# Patient Record
Sex: Male | Born: 1970 | Race: White | Hispanic: No | Marital: Married | Smoking: Never smoker
Health system: Southern US, Community
[De-identification: ages and names within clinical notes are randomized; demographics above are authoritative.]

## PROBLEM LIST (undated history)

## (undated) DIAGNOSIS — R7303 Prediabetes: Secondary | ICD-10-CM

## (undated) DIAGNOSIS — E785 Hyperlipidemia, unspecified: Secondary | ICD-10-CM

## (undated) DIAGNOSIS — T7840XA Allergy, unspecified, initial encounter: Secondary | ICD-10-CM

## (undated) DIAGNOSIS — F419 Anxiety disorder, unspecified: Secondary | ICD-10-CM

## (undated) DIAGNOSIS — F909 Attention-deficit hyperactivity disorder, unspecified type: Secondary | ICD-10-CM

## (undated) DIAGNOSIS — Z973 Presence of spectacles and contact lenses: Secondary | ICD-10-CM

## (undated) DIAGNOSIS — I10 Essential (primary) hypertension: Secondary | ICD-10-CM

## (undated) DIAGNOSIS — K5792 Diverticulitis of intestine, part unspecified, without perforation or abscess without bleeding: Secondary | ICD-10-CM

## (undated) HISTORY — PX: COLON SURGERY: SHX602

## (undated) HISTORY — DX: Attention-deficit hyperactivity disorder, unspecified type: F90.9

## (undated) HISTORY — DX: Hyperlipidemia, unspecified: E78.5

## (undated) HISTORY — DX: Diverticulitis of intestine, part unspecified, without perforation or abscess without bleeding: K57.92

## (undated) HISTORY — DX: Prediabetes: R73.03

## (undated) HISTORY — DX: Presence of spectacles and contact lenses: Z97.3

## (undated) HISTORY — DX: Allergy, unspecified, initial encounter: T78.40XA

## (undated) HISTORY — PX: VASECTOMY: SHX75

## (undated) HISTORY — DX: Essential (primary) hypertension: I10

## (undated) HISTORY — PX: KNEE SURGERY: SHX244

## (undated) HISTORY — DX: Anxiety disorder, unspecified: F41.9

---

## 1990-12-16 HISTORY — PX: KNEE SURGERY: SHX244

## 1991-12-17 HISTORY — PX: ANKLE SURGERY: SHX546

## 2005-09-25 ENCOUNTER — Encounter: Payer: Self-pay | Admitting: Internal Medicine

## 2005-09-25 ENCOUNTER — Ambulatory Visit: Payer: Self-pay | Admitting: Family Medicine

## 2005-09-25 ENCOUNTER — Encounter: Admission: RE | Admit: 2005-09-25 | Discharge: 2005-09-25 | Payer: Self-pay | Admitting: Family Medicine

## 2005-09-25 LAB — CONVERTED CEMR LAB: PSA: 0.37 ng/mL

## 2005-12-29 ENCOUNTER — Inpatient Hospital Stay (HOSPITAL_COMMUNITY): Admission: EM | Admit: 2005-12-29 | Discharge: 2005-12-31 | Payer: Self-pay | Admitting: Emergency Medicine

## 2005-12-29 ENCOUNTER — Encounter: Payer: Self-pay | Admitting: Internal Medicine

## 2005-12-29 ENCOUNTER — Ambulatory Visit: Payer: Self-pay | Admitting: Internal Medicine

## 2006-01-14 ENCOUNTER — Ambulatory Visit: Payer: Self-pay | Admitting: Internal Medicine

## 2007-03-03 ENCOUNTER — Ambulatory Visit: Payer: Self-pay | Admitting: Internal Medicine

## 2007-03-03 LAB — CONVERTED CEMR LAB
ALT: 45 units/L — ABNORMAL HIGH (ref 0–40)
AST: 31 units/L (ref 0–37)
Albumin: 4.3 g/dL (ref 3.5–5.2)
Alkaline Phosphatase: 49 units/L (ref 39–117)
BUN: 14 mg/dL (ref 6–23)
Basophils Absolute: 0 10*3/uL (ref 0.0–0.1)
Basophils Relative: 0.2 % (ref 0.0–1.0)
Bilirubin, Direct: 0.2 mg/dL (ref 0.0–0.3)
CO2: 30 meq/L (ref 19–32)
Calcium: 9.4 mg/dL (ref 8.4–10.5)
Chloride: 101 meq/L (ref 96–112)
Creatinine, Ser: 1.1 mg/dL (ref 0.4–1.5)
Eosinophils Absolute: 0.2 10*3/uL (ref 0.0–0.6)
Eosinophils Relative: 4 % (ref 0.0–5.0)
GFR calc Af Amer: 98 mL/min
GFR calc non Af Amer: 81 mL/min
Glucose, Bld: 86 mg/dL (ref 70–99)
HCT: 45.6 % (ref 39.0–52.0)
Hemoglobin: 16 g/dL (ref 13.0–17.0)
Lymphocytes Relative: 21 % (ref 12.0–46.0)
MCHC: 35.2 g/dL (ref 30.0–36.0)
MCV: 85.9 fL (ref 78.0–100.0)
Monocytes Absolute: 0.5 10*3/uL (ref 0.2–0.7)
Monocytes Relative: 8.9 % (ref 3.0–11.0)
Neutro Abs: 3.7 10*3/uL (ref 1.4–7.7)
Neutrophils Relative %: 65.9 % (ref 43.0–77.0)
Platelets: 184 10*3/uL (ref 150–400)
Potassium: 4.2 meq/L (ref 3.5–5.1)
RBC: 5.31 M/uL (ref 4.22–5.81)
RDW: 12 % (ref 11.5–14.6)
Sodium: 135 meq/L (ref 135–145)
TSH: 1.64 microintl units/mL (ref 0.35–5.50)
Total Bilirubin: 1.1 mg/dL (ref 0.3–1.2)
Total Protein: 6.8 g/dL (ref 6.0–8.3)
WBC: 5.6 10*3/uL (ref 4.5–10.5)

## 2007-04-27 ENCOUNTER — Encounter: Payer: Self-pay | Admitting: Internal Medicine

## 2007-04-27 DIAGNOSIS — J301 Allergic rhinitis due to pollen: Secondary | ICD-10-CM | POA: Insufficient documentation

## 2007-04-27 DIAGNOSIS — E785 Hyperlipidemia, unspecified: Secondary | ICD-10-CM | POA: Insufficient documentation

## 2007-04-27 DIAGNOSIS — J309 Allergic rhinitis, unspecified: Secondary | ICD-10-CM

## 2007-04-27 DIAGNOSIS — F411 Generalized anxiety disorder: Secondary | ICD-10-CM | POA: Insufficient documentation

## 2007-05-04 ENCOUNTER — Ambulatory Visit: Payer: Self-pay | Admitting: Internal Medicine

## 2007-11-05 ENCOUNTER — Ambulatory Visit: Payer: Self-pay | Admitting: Internal Medicine

## 2007-11-05 DIAGNOSIS — F900 Attention-deficit hyperactivity disorder, predominantly inattentive type: Secondary | ICD-10-CM | POA: Insufficient documentation

## 2007-12-03 ENCOUNTER — Ambulatory Visit: Payer: Self-pay | Admitting: Internal Medicine

## 2008-01-01 ENCOUNTER — Telehealth (INDEPENDENT_AMBULATORY_CARE_PROVIDER_SITE_OTHER): Payer: Self-pay | Admitting: *Deleted

## 2008-01-17 ENCOUNTER — Emergency Department (HOSPITAL_COMMUNITY): Admission: EM | Admit: 2008-01-17 | Discharge: 2008-01-18 | Payer: Self-pay | Admitting: Emergency Medicine

## 2008-01-28 ENCOUNTER — Ambulatory Visit: Payer: Self-pay | Admitting: Internal Medicine

## 2008-02-01 ENCOUNTER — Telehealth (INDEPENDENT_AMBULATORY_CARE_PROVIDER_SITE_OTHER): Payer: Self-pay | Admitting: *Deleted

## 2008-03-02 ENCOUNTER — Telehealth (INDEPENDENT_AMBULATORY_CARE_PROVIDER_SITE_OTHER): Payer: Self-pay | Admitting: *Deleted

## 2008-04-05 ENCOUNTER — Telehealth (INDEPENDENT_AMBULATORY_CARE_PROVIDER_SITE_OTHER): Payer: Self-pay | Admitting: *Deleted

## 2008-05-03 ENCOUNTER — Telehealth (INDEPENDENT_AMBULATORY_CARE_PROVIDER_SITE_OTHER): Payer: Self-pay | Admitting: *Deleted

## 2008-05-31 ENCOUNTER — Ambulatory Visit: Payer: Self-pay | Admitting: Internal Medicine

## 2008-07-05 ENCOUNTER — Telehealth (INDEPENDENT_AMBULATORY_CARE_PROVIDER_SITE_OTHER): Payer: Self-pay | Admitting: *Deleted

## 2008-08-04 ENCOUNTER — Telehealth (INDEPENDENT_AMBULATORY_CARE_PROVIDER_SITE_OTHER): Payer: Self-pay | Admitting: *Deleted

## 2008-09-02 ENCOUNTER — Telehealth: Payer: Self-pay | Admitting: Internal Medicine

## 2008-10-03 ENCOUNTER — Telehealth: Payer: Self-pay | Admitting: Internal Medicine

## 2008-10-04 ENCOUNTER — Ambulatory Visit: Payer: Self-pay | Admitting: Family Medicine

## 2008-11-08 ENCOUNTER — Telehealth (INDEPENDENT_AMBULATORY_CARE_PROVIDER_SITE_OTHER): Payer: Self-pay | Admitting: *Deleted

## 2008-12-07 ENCOUNTER — Telehealth: Payer: Self-pay | Admitting: Internal Medicine

## 2009-01-05 ENCOUNTER — Telehealth: Payer: Self-pay | Admitting: Internal Medicine

## 2009-02-06 ENCOUNTER — Telehealth: Payer: Self-pay | Admitting: Internal Medicine

## 2009-02-09 ENCOUNTER — Ambulatory Visit: Payer: Self-pay | Admitting: Internal Medicine

## 2009-03-08 ENCOUNTER — Telehealth: Payer: Self-pay | Admitting: Internal Medicine

## 2009-03-30 ENCOUNTER — Ambulatory Visit: Payer: Self-pay | Admitting: Family Medicine

## 2009-04-10 ENCOUNTER — Telehealth: Payer: Self-pay | Admitting: Internal Medicine

## 2009-05-02 ENCOUNTER — Ambulatory Visit: Payer: Self-pay | Admitting: Family Medicine

## 2009-05-11 ENCOUNTER — Telehealth: Payer: Self-pay | Admitting: Internal Medicine

## 2009-06-09 ENCOUNTER — Telehealth (INDEPENDENT_AMBULATORY_CARE_PROVIDER_SITE_OTHER): Payer: Self-pay | Admitting: *Deleted

## 2009-06-13 ENCOUNTER — Ambulatory Visit: Payer: Self-pay | Admitting: Family Medicine

## 2009-07-11 ENCOUNTER — Telehealth: Payer: Self-pay | Admitting: Internal Medicine

## 2009-07-13 ENCOUNTER — Ambulatory Visit: Payer: Self-pay | Admitting: Family Medicine

## 2009-08-08 ENCOUNTER — Ambulatory Visit: Payer: Self-pay | Admitting: Internal Medicine

## 2009-09-13 ENCOUNTER — Telehealth: Payer: Self-pay | Admitting: Internal Medicine

## 2009-09-15 ENCOUNTER — Ambulatory Visit: Payer: Self-pay | Admitting: Internal Medicine

## 2009-10-19 ENCOUNTER — Telehealth: Payer: Self-pay | Admitting: Internal Medicine

## 2009-11-23 ENCOUNTER — Telehealth: Payer: Self-pay | Admitting: Internal Medicine

## 2009-12-07 ENCOUNTER — Ambulatory Visit: Payer: Self-pay | Admitting: Family Medicine

## 2009-12-13 ENCOUNTER — Ambulatory Visit: Payer: Self-pay | Admitting: Family Medicine

## 2010-01-10 ENCOUNTER — Telehealth: Payer: Self-pay | Admitting: Internal Medicine

## 2010-02-12 ENCOUNTER — Ambulatory Visit: Payer: Self-pay | Admitting: Internal Medicine

## 2010-03-22 ENCOUNTER — Telehealth: Payer: Self-pay | Admitting: Internal Medicine

## 2010-05-01 ENCOUNTER — Telehealth: Payer: Self-pay | Admitting: Internal Medicine

## 2010-05-04 ENCOUNTER — Telehealth: Payer: Self-pay | Admitting: Internal Medicine

## 2010-06-08 ENCOUNTER — Telehealth: Payer: Self-pay | Admitting: Internal Medicine

## 2010-07-11 ENCOUNTER — Telehealth: Payer: Self-pay | Admitting: Internal Medicine

## 2010-08-08 ENCOUNTER — Ambulatory Visit: Payer: Self-pay | Admitting: Internal Medicine

## 2010-08-09 ENCOUNTER — Ambulatory Visit: Payer: Self-pay | Admitting: Family Medicine

## 2010-08-09 DIAGNOSIS — M719 Bursopathy, unspecified: Secondary | ICD-10-CM

## 2010-08-09 DIAGNOSIS — M67919 Unspecified disorder of synovium and tendon, unspecified shoulder: Secondary | ICD-10-CM | POA: Insufficient documentation

## 2010-08-15 ENCOUNTER — Encounter: Admission: RE | Admit: 2010-08-15 | Discharge: 2010-08-15 | Payer: Self-pay | Admitting: Family Medicine

## 2010-09-18 ENCOUNTER — Telehealth: Payer: Self-pay | Admitting: Internal Medicine

## 2010-09-28 ENCOUNTER — Encounter: Payer: Self-pay | Admitting: Internal Medicine

## 2010-10-10 ENCOUNTER — Ambulatory Visit: Payer: Self-pay | Admitting: Internal Medicine

## 2010-10-10 DIAGNOSIS — S335XXA Sprain of ligaments of lumbar spine, initial encounter: Secondary | ICD-10-CM | POA: Insufficient documentation

## 2010-10-25 ENCOUNTER — Telehealth: Payer: Self-pay | Admitting: Internal Medicine

## 2010-10-30 ENCOUNTER — Telehealth: Payer: Self-pay | Admitting: Internal Medicine

## 2010-10-30 ENCOUNTER — Encounter: Payer: Self-pay | Admitting: Internal Medicine

## 2010-11-22 ENCOUNTER — Emergency Department (HOSPITAL_COMMUNITY): Admission: EM | Admit: 2010-11-22 | Discharge: 2010-05-04 | Payer: Self-pay | Admitting: Emergency Medicine

## 2010-11-30 ENCOUNTER — Telehealth: Payer: Self-pay | Admitting: Family Medicine

## 2010-12-05 ENCOUNTER — Ambulatory Visit: Payer: Self-pay | Admitting: Internal Medicine

## 2010-12-05 DIAGNOSIS — J019 Acute sinusitis, unspecified: Secondary | ICD-10-CM

## 2010-12-16 DIAGNOSIS — K5792 Diverticulitis of intestine, part unspecified, without perforation or abscess without bleeding: Secondary | ICD-10-CM

## 2010-12-16 HISTORY — DX: Diverticulitis of intestine, part unspecified, without perforation or abscess without bleeding: K57.92

## 2010-12-19 ENCOUNTER — Encounter: Payer: Self-pay | Admitting: Internal Medicine

## 2011-01-07 ENCOUNTER — Telehealth: Payer: Self-pay | Admitting: Internal Medicine

## 2011-01-15 NOTE — Assessment & Plan Note (Signed)
Summary: back pain/alc   Vital Signs:  Patient profile:   40 year old male Weight:      242.25 pounds Temp:     98.3 degrees F oral Pulse rate:   64 / minute Pulse rhythm:   regular BP sitting:   138 / 90  (left arm) Cuff size:   large  Vitals Entered By: Selena Batten Dance CMA Duncan Dull) (October 10, 2010 8:27 AM) CC: Back pain   History of Present Illness: CC: back pain  3d h/o lower back pain.  Severe sharp pain when occurs.  No radiation, no radiculopathy.  No paresthesias, numbness.  No fevers/chills, bowel/bladder accidents.  once every 3 years bothers him.  No inciting trauma/injury.  taking 2 alleve twice a day and advil here and there as well as some left over robaxin.  Did have awkward land while playing ball in college (this started pain).  No back surgeries.  no smokers at home.  shoulder better after anterior injection.  To start ice skating, notes never has back pain when ice skating 2/2 core muscle strengthening.  Current Medications (verified): 1)  Vyvanse 70 Mg  Caps (Lisdexamfetamine Dimesylate) .Marland Kitchen.. 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  mild L paraspinous mm spasm/tightness.  limited flexion/extension at spine 2/2 pain (does not try).  full ROM in lateral rotation and bending  negative SLR test, no pain with int/ext rotation at hips bilaterally Pulses:  2+ in feet Extremities:  no edema Neurologic:  alert & oriented X3, strength normal in all extremities, and gait normal.     Impression &  Recommendations:  Problem # 1:  LUMBAR STRAIN (ICD-847.2) likely this.  treat conservatively with nsaids, muscle relaxant.  red flags to return discussed, ad vised to return if not improving as expected.  Complete Medication List: 1)  Vyvanse 70 Mg Caps (Lisdexamfetamine dimesylate) .Marland Kitchen.. 1 daily as directed 2)  Aleve 220 Mg Tabs (Naproxen sodium) .... 2 pills twice daily 3)  Flexeril 10 Mg Tabs (Cyclobenzaprine hcl) .... Take one three times a day as needed muscle spasm  Patient Instructions: 1)  I think you have lumbar strain. 2)  Continue alleve and start flexeril for next several days. 3)  Ice/heat to back. 4)  If not getting better you will need to return to be seen. 5)  Good to meet you today, call clinic with quesitons. Prescriptions: FLEXERIL 10 MG TABS (CYCLOBENZAPRINE HCL) take one three times a day as needed muscle spasm  #40 x 0   Entered and Authorized by:   Eustaquio Boyden  MD   Signed by:   Eustaquio Boyden  MD on 10/10/2010   Method used:   Electronically to        [facility]  [place] (retail)       [address]       [place], [place]  [address]       Ph: [phone] or [phone]       Fax: [phone]   RxID:   [id number]    Orders Added: 1)  Est. Patient Level  III [id number]    Current Allergies (reviewed today): ADDERALL (AMPHETAMINE-DEXTROAMPHETAMINE)

## 2011-01-15 NOTE — Progress Notes (Signed)
Summary: VYVANSE  Phone Note Refill Request Call back at Work Phone [phone] Message from:  Patient on October 25, 2010 10:00 AM  Refills Requested: Medication #1:  VYVANSE 70 MG  CAPS 1 daily as directed  Method Requested: Pick up at Office Initial call taken by: Mervin Hack CMA Duncan Dull),  October 25, 2010 10:00 AM  Follow-up for Phone Call        Rx written Follow-up by: Cindee Salt MD,  October 25, 2010 12:42 PM  Additional Follow-up for Phone Call Additional follow up Details #1::        Spoke with patient and advised rx ready for pick-up  Additional Follow-up by: Mervin Hack CMA Duncan Dull),  October 25, 2010 2:45 PM    Prescriptions: VYVANSE 70 MG  CAPS (LISDEXAMFETAMINE DIMESYLATE) 1 daily as directed  #30 x 0   Entered and Authorized by:   Cindee Salt MD   Signed by:   Cindee Salt MD on 10/25/2010   Method used:   Print then Give to Patient   RxID:   [id number]

## 2011-01-15 NOTE — Progress Notes (Signed)
Summary: vyvance  Phone Note Refill Request Call back at Work Phone [phone] Message from:  Patient on July 11, 2010 2:46 PM  Refills Requested: Medication #1:  VYVANSE 70 MG  CAPS 1 daily as directed.  Method Requested: Pick up at Office Initial call taken by: Melody Comas,  July 11, 2010 2:46 PM  Follow-up for Phone Call        Rx written Follow-up by: Cindee Salt MD,  July 11, 2010 2:47 PM  Additional Follow-up for Phone Call Additional follow up Details #1::        Patient advised. Rx left at front for pick up.  Additional Follow-up by: Melody Comas,  July 11, 2010 3:16 PM    Prescriptions: VYVANSE 70 MG  CAPS (LISDEXAMFETAMINE DIMESYLATE) 1 daily as directed  #30 x 0   Entered and Authorized by:   Cindee Salt MD   Signed by:   Cindee Salt MD on 07/11/2010   Method used:   Print then Give to Patient   RxID:   [id number]

## 2011-01-15 NOTE — Miscellaneous (Signed)
Summary: Consent to Special Procedure/[facility]  Consent to Special Procedure/ [facility]   Imported By: Lanelle Bal 12/19/2009 11:31:44  _____________________________________________________________________  External Attachment:    Type:   Image     Comment:   External Document

## 2011-01-15 NOTE — Progress Notes (Signed)
Summary: refill request for vyvanse  Phone Note Refill Request Call back at Home Phone [phone] Message from:  Patient  Refills Requested: Medication #1:  VYVANSE 70 MG  CAPS 1 daily as directed. Please call pt when ready.  Initial call taken by: Lowella Petties CMA,  May 01, 2010 4:18 PM  Follow-up for Phone Call        Rx written Follow-up by: Cindee Salt MD,  May 02, 2010 9:14 AM  Additional Follow-up for Phone Call Additional follow up Details #1::        left message on machine that rx ready for pick-up  Additional Follow-up by: DeShannon Smith CMA Duncan Dull),  May 02, 2010 9:29 AM    Prescriptions: VYVANSE 70 MG  CAPS (LISDEXAMFETAMINE DIMESYLATE) 1 daily as directed  #30 x 0   Entered and Authorized by:   Cindee Salt MD   Signed by:   Cindee Salt MD on 05/02/2010   Method used:   Print then Give to Patient   RxID:   [id number]

## 2011-01-15 NOTE — Progress Notes (Signed)
Summary: Rx Vyvanse  Phone Note Refill Request Call back at Work Phone [phone] Message from:  Patient on January 10, 2010 1:09 PM  Refills Requested: Medication #1:  VYVANSE 70 MG  CAPS 1 daily as directed Patient called to request a refill.  Please advise   Method Requested: Pick up at Office Initial call taken by: Linde Gillis CMA Duncan Dull),  January 10, 2010 1:11 PM  Follow-up for Phone Call        Rx written Follow-up by: Cindee Salt MD,  January 10, 2010 1:57 PM  Additional Follow-up for Phone Call Additional follow up Details #1::        Spoke with patient and advised rx ready for pick-up  Additional Follow-up by: Mervin Hack CMA Duncan Dull),  January 10, 2010 2:48 PM    Prescriptions: VYVANSE 70 MG  CAPS (LISDEXAMFETAMINE DIMESYLATE) 1 daily as directed  #30 x 0   Entered and Authorized by:   Cindee Salt MD   Signed by:   Cindee Salt MD on 01/10/2010   Method used:   Print then Give to Patient   RxID:   [id number]

## 2011-01-15 NOTE — Letter (Signed)
Summary: [facility]  [facility]   Imported By: Maryln Gottron 10/17/2010 12:36:48  _____________________________________________________________________  External Attachment:    Type:   Image     Comment:   External Document  Appended Document: [facility] continued left shoulder problems tried glenohumeral injection

## 2011-01-15 NOTE — Assessment & Plan Note (Signed)
Summary: 6 MONTH F/UP, L SHOULDER PAIN/RBH   Vital Signs:  Patient profile:   40 year old male Weight:      236 pounds Temp:     98.4 degrees F oral Pulse rate:   76 / minute Pulse rhythm:   regular BP sitting:   120 / 80  (left arm) Cuff size:   large  Vitals Entered By: Mervin Hack CMA Duncan Dull) (August 08, 2010 8:38 AM) CC: 6 month follow-up   History of Present Illness: Doing okay  Had another flare of diverticulitis in may did improve with the antibiotics  Still having trouble with left shoulder Marked reduced ROM---can't even putt Has given up lifting weight Limited abduction Gives him trouble at work  uses the vyvanse for work Still helps his concentration  Allergies: 1)  Adderall (Amphetamine-Dextroamphetamine)  Past History:  Past medical, surgical, family and social histories (including risk factors) reviewed for relevance to current acute and chronic problems.  Past Medical History: Reviewed history from 11/05/2007 and no changes required. Allergic rhinitis Anxiety Hyperlipidemia ADHD  Past Surgical History: Diverticulitis  1/07, 5/11  Family History: Reviewed history from 02/09/2009 and no changes required. Father: Alive Mother: Died at age 69, complications diabetes,?MI Siblings: 2 brothers, 1 sister, good health Mat GM died of Altzheimers Strong FH of prostate cancer Mat GF, uncle also died of MIs  Social History: Reviewed history from 02/12/2010 and no changes required. 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         chronic sleep problems---relates to flexible shifts (switches from 1st to 3rd) appetite is fine  Physical Exam  General:  alert and normal appearance.   Neck:  supple, no masses, and no thyromegaly.   Lungs:  normal respiratory effort, no intercostal retractions, no  accessory muscle use, and normal breath sounds.   Heart:  normal rate, regular rhythm, no murmur, and no gallop.   Msk:  mild decreased abduction of left shoulder but fair passive internal and external rotation Popping and crackling with ROM  no other active synovitis Extremities:  no edema Psych:  normally interactive, good eye contact, not anxious appearing, and not depressed appearing.     Impression & Recommendations:  Problem # 1:  SHOULDER PAIN, LEFT (ICD-719.41) Assessment New seems more arthritic now very limiting little help from aleve will set up with Dr Patsy Lager  Problem # 2:  ATTENTION DEFICIT DISORDER (ICD-314.00) Assessment: Unchanged doing fine with the med will continue  Complete Medication List: 1)  Vyvanse 70 Mg Caps (Lisdexamfetamine dimesylate) .Marland Kitchen.. 1 daily as directed  Patient Instructions: 1)  Please set up appt with Dr Patsy Lager to evaluate left shoulder pain 2)  Please schedule a follow-up appointment in 6 months for physical Prescriptions: VYVANSE 70 MG  CAPS (LISDEXAMFETAMINE DIMESYLATE) 1 daily as directed  #30 x 0   Entered and Authorized by:   Cindee Salt MD   Signed by:   Cindee Salt MD on 08/08/2010   Method used:   Print then Give to Patient   RxID:   [id number]   Current Allergies (reviewed today): ADDERALL (AMPHETAMINE-DEXTROAMPHETAMINE)  Appended Document: Orders Update    Clinical Lists Changes  Orders: Added new Service order of Est. Patient Level IV (56213) - Signed

## 2011-01-15 NOTE — Progress Notes (Signed)
Summary: call a nurse   Phone Note Call from Patient   Summary of Call: Triage Record Num: [id number] Operator: Peri Jefferson Patient Name: John Adkins Call Date & Time: 05/03/2010 11:39:51PM Patient Phone: [phone] PCP: [name] Patient Gender: Male PCP Fax : Patient DOB: 1971/04/24 Practice Name: [facility] Reason for Call: [name] calling because he developed left sided abdominal pain on 05/02/10. Has hx of diverticulitis. Pain is unbearable now. Temp 100.3 O. No diarrhea. [facility]. Protocol(s) Used: Abdominal Pain / Discomfort Recommended Outcome per Protocol: See ED Immediately Reason for Outcome: Unbearable abdominal/pelvic pain Care Advice:  ~ Another adult should drive.  ~ Pain medication or laxatives should not be taken until symptoms are evaluated.  ~ Do not eat or drink anything until evaluated by provider. Call EMS 911 if signs and symptoms of shock develop (such as unable to stand due to faintness, dizziness, or lightheadedness; new onset of confusion; slow to respond or difficult to awaken; skin is pale, gray, cool, or moist to touch; severe weakness; loss of consciousness).  ~  ~ IMMEDIATE ACTION 05/03/2010 11:47:11PM Page 1 of 1 [email or web address] Initial call taken by: Melody Comas,  May 04, 2010 10:39 AM  Follow-up for Phone Call        Please check on him Cindee Salt MD  May 04, 2010 11:00 AM   pt states he feels better, still in some pain but not as bad, he went to [facility], he will call for appt if needed. DeShannon Smith CMA Duncan Dull)  May 04, 2010 2:22 PM    noted Follow-up by: Cindee Salt MD,  May 04, 2010 2:24 PM

## 2011-01-15 NOTE — Assessment & Plan Note (Signed)
Summary: CPX / LFW   Vital Signs:  Patient profile:   40 year old male Weight:      234 pounds Temp:     98.4 degrees F oral Pulse rate:   80 / minute Pulse rhythm:   regular BP sitting:   118 / 78  (left arm) Cuff size:   large  Vitals Entered By: Mervin Hack CMA Duncan Dull) (February 12, 2010 2:59 PM) CC: adult physical   History of Present Illness:  Working too many hours not sleeping enough  ongoing trouble with left shoulder seemed to worsen when he lifted for something Uses power flex machine at [employer] to work on it  Still uses the vyvanse regularly only for work in general does forget the dose sometimes He does note a difference with poor concentration at work  still chewing tobacco has tried patch in past---used 2 patches and gum when he quit  Discussed trying this again  Allergies: 1)  Adderall (Amphetamine-Dextroamphetamine)  Past History:  Past medical, surgical, family and social histories (including risk factors) reviewed for relevance to current acute and chronic problems.  Past Medical History: Reviewed history from 11/05/2007 and no changes required. Allergic rhinitis Anxiety Hyperlipidemia ADHD  Past Surgical History: Reviewed history from 04/27/2007 and no changes required. Diverticulitis 01/07  Family History: Reviewed history from 02/09/2009 and no changes required. Father: Alive Mother: Died at age 54, complications diabetes,?MI Siblings: 2 brothers, 1 sister, good health Mat GM died of Altzheimers Strong FH of prostate cancer Mat GF, uncle also died of MIs  Social History: Reviewed history from 02/09/2009 and no changes required. 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 Hard time when works first shift. May go back to  weekends eventually Wears seat belt. Eyes:  Denies double vision and vision loss-1 eye. ENT:  Denies decreased hearing and ringing in ears; teeth okay--overdue for dentist. CV:  Complains of lightheadness; denies chest pain or discomfort, difficulty breathing at night, difficulty breathing while lying down, palpitations, and shortness of breath with exertion; occ dizziness if he stands up too quick. Resp:  Denies cough and shortness of breath. GI:  Denies abdominal pain, bloody stools, change in bowel habits, dark tarry stools, and indigestion; recent stomach but in house . GU:  Denies erectile dysfunction, urinary frequency, and urinary hesitancy. MS:  Complains of joint pain; denies joint swelling; just shoulders. Derm:  Complains of rash; denies lesion(s); occ brief rash that comes and goes--wonders about some allergic exposure in house. Neuro:  Denies headaches, numbness, tingling, and weakness. Psych:  Denies anxiety and depression. Heme:  Denies abnormal bruising and enlarge lymph nodes. Allergy:  Denies seasonal allergies and sneezing.  Physical Exam  General:  alert and normal appearance.   Eyes:  pupils equal, pupils round, pupils reactive to light, and no optic disk abnormalities.   Ears:  R ear normal and L ear normal.   Mouth:  no erythema and no lesions.   Neck:  supple, no masses, no thyromegaly, no carotid bruits, and no cervical lymphadenopathy.   Lungs:  normal respiratory effort and normal breath sounds.   Heart:  normal rate, regular rhythm, no murmur, and no gallop.   Abdomen:  soft and non-tender.   Msk:  no joint tenderness and no joint swelling.   Left shoulder has moderate decrease  in external rotation  Pulses:  2+ in feet Extremities:  no edema Neurologic:  alert & oriented X3, strength normal in all extremities, and gait normal.   Skin:  no rashes and no suspicious lesions.   Axillary Nodes:  No palpable lymphadenopathy Psych:  normally interactive, good eye  contact, not anxious appearing, and not depressed appearing.     Impression & Recommendations:  Problem # 1:  LOW BACK PAIN, ACUTE (ICD-724.2) Assessment Comment Only counselled on healthy behaviors discussed stopping tobacco  Problem # 2:  ATTENTION DEFICIT DISORDER (ICD-314.00) Assessment: Unchanged does okay with the medicine  Complete Medication List: 1)  Vyvanse 70 Mg Caps (Lisdexamfetamine dimesylate) .Marland Kitchen.. 1 daily as directed  Patient Instructions: 1)  Please schedule a follow-up appointment in 6 months .  Prescriptions: VYVANSE 70 MG  CAPS (LISDEXAMFETAMINE DIMESYLATE) 1 daily as directed  #30 x 0   Entered and Authorized by:   Cindee Salt MD   Signed by:   Cindee Salt MD on 02/12/2010   Method used:   Print then Give to Patient   RxID:   [id number]   Current Allergies (reviewed today): ADDERALL (AMPHETAMINE-DEXTROAMPHETAMINE)

## 2011-01-15 NOTE — Assessment & Plan Note (Signed)
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 I saw him last year and he was having some significant rotator cuff tendinitis, and that, the subacromial injection, he went to physical therapy for formal scapular stabilization and rotator cuff strengthening. He got quite a bit better at that point. He never became fully asymptomatic, however he did get better, but now he has had some drastic return of symptoms, and significant pain.  Severe pain with reaching across the body, he does have a dull ache and pain at nighttime, and has limited his physical activity.  He used to be a Engineering geologist. Now he cannot lift weights, he cannot even play golf.  REVIEW OF SYSTEMS  GEN: No systemic complaints, no fevers, chills, sweats, or other acute illnesses MSK: Detailed in the HPI GI: tolerating PO intake without difficulty Neuro: No numbness, parasthesias, or tingling associated. Otherwise the pertinent positives of the ROS are noted above.    Allergies: 1)  Adderall (Amphetamine-Dextroamphetamine)  Physical Exam  General:  GEN: Well-developed,well-nourished,in no acute distress; alert,appropriate and cooperative throughout examination HEENT: Normocephalic and atraumatic without obvious abnormalities. No apparent alopecia or balding. Ears, externally no deformities PULM: Breathing comfortably in no respiratory distress EXT: No clubbing, cyanosis, or edema PSYCH: Normally interactive. Cooperative  during the interview. Pleasant. Friendly and conversant. Not anxious or depressed appearing. Normal, full affect.  Msk:  Shoulder: L Inspection: No muscle wasting or winging Ecchymosis/edema: neg  AC joint, scapula, clavicle: AC TTP Cervical spine: NT, full ROM Spurling's: neg Abduction: full, 5/5 Flexion: full, 5/5 IR, full, lift-off: 5/5 ER at neutral: full, 5/5 AC crossover: MARKEDLY POS Neer: pos Hawkins: pos Drop Test: neg Empty Can: pos Supraspinatus insertion: mild-mod T Bicipital groove: TTP Speed's: POS Yergason's: POS Sulcus sign: neg Scapular dyskinesis: none C5-T1 intact  Neuro: Sensation intact Grip 5/5    Impression & Recommendations:  Problem # 1:  ROTATOR CUFF SYNDROME, LEFT (ICD-726.10) left-sided, severe rotator cuff tendinopathy, with probable subacromial bursitis. Severely symptomatic a.c. joint as well. Very knowledgeable collegiate baseball player, who has been doing rotator cuff strengthening and scapular stabilization, status post one subacromial injection.  Given his clinical presentation, think it is most prudent to obtain formal films, plain x-rays, an MRI of his left shoulder to evaluate his rotator cuff and acromioclavicular joint, as well as glenohumeral joint.  Symptomatic > 1 year, failure conservative management  I'm going to consult Dr. Ave Filter about potential definitive management.  Orders: T-Shoulder Left Min 2 Views [phone]) Radiology Referral (Radiology) Orthopedic Surgeon Referral (Ortho Surgeon)  Problem # 2:  SHOULDER PAIN, LEFT (ICD-719.41)  Orders: T-Shoulder Left Min 2 Views [phone]) Radiology Referral (Radiology) Orthopedic Surgeon Referral (Ortho Surgeon)  Complete Medication List: 1)  Vyvanse 70 Mg Caps (Lisdexamfetamine dimesylate) .Marland Kitchen.. 1 daily as directed  Patient Instructions: 1)  Referral Appointment Information 2)  Day/Date: 3)  Time: 4)  Place/MD: 5)  Address: 6)  Phone/Fax: 7)  Patient given  appointment information. Information/Orders faxed/mailed.   Current Allergies (reviewed today): ADDERALL (AMPHETAMINE-DEXTROAMPHETAMINE)

## 2011-01-15 NOTE — Progress Notes (Signed)
Summary: REQ MEDS REFILL VYVANSE  Phone Note Refill Request   Refills Requested: Medication #1:  VYVANSE 70MG  NEED RX REFILL FOR VYVANSE 70MG - PT TO PICK UP CALL BACK # [phone]   Method Requested: Pick up at Office Initial call taken by: Daine Gip,  June 08, 2010 1:54 PM  Follow-up for Phone Call        Rx written Follow-up by: Cindee Salt MD,  June 08, 2010 1:58 PM  Additional Follow-up for Phone Call Additional follow up Details #1::        Spoke with patient and advised rx ready for pick-up  Additional Follow-up by: Mervin Hack CMA Duncan Dull),  June 08, 2010 3:19 PM    Prescriptions: VYVANSE 70 MG  CAPS (LISDEXAMFETAMINE DIMESYLATE) 1 daily as directed  #30 x 0   Entered and Authorized by:   Cindee Salt MD   Signed by:   Cindee Salt MD on 06/08/2010   Method used:   Print then Give to Patient   RxID:   [id number]

## 2011-01-15 NOTE — Letter (Signed)
Summary: Proof of Physical Form/[employer]  Proof of Physical Form/[employer]   Imported By: Lanelle Bal 11/07/2010 15:26:54  _____________________________________________________________________  External Attachment:    Type:   Image     Comment:   External Document

## 2011-01-15 NOTE — Progress Notes (Signed)
Summary: refill request for vyvanse  Phone Note Refill Request Call back at Work Phone [phone] Message from:  Patient  Refills Requested: Medication #1:  VYVANSE 70 MG  CAPS 1 daily as directed. Please call when ready.  Initial call taken by: Lowella Petties CMA,  September 18, 2010 3:40 PM Caller: Patient  Follow-up for Phone Call        Rx written Follow-up by: Cindee Salt MD,  September 19, 2010 7:50 AM  Additional Follow-up for Phone Call Additional follow up Details #1::        left message on machine at work that rx ready for pick-up  Additional Follow-up by: Mervin Hack CMA Duncan Dull),  September 19, 2010 8:41 AM    Prescriptions: VYVANSE 70 MG  CAPS (LISDEXAMFETAMINE DIMESYLATE) 1 daily as directed  #30 x 0   Entered and Authorized by:   Cindee Salt MD   Signed by:   Cindee Salt MD on 09/19/2010   Method used:   Print then Give to Patient   RxID:   [id number]

## 2011-01-15 NOTE — Progress Notes (Signed)
Summary: needs refill on vyvanse  Phone Note Refill Request Call back at Work Phone [phone] Message from:  Patient  Refills Requested: Medication #1:  VYVANSE 70 MG  CAPS 1 daily as directed. Initial call taken by: Lowella Petties CMA,  March 22, 2010 3:27 PM  Follow-up for Phone Call        Rx written Follow-up by: Cindee Salt MD,  March 23, 2010 8:12 AM  Additional Follow-up for Phone Call Additional follow up Details #1::        Spoke with patient and advised rx ready for pick-up  Additional Follow-up by: Mervin Hack CMA Duncan Dull),  March 23, 2010 8:38 AM    New/Updated Medications: VYVANSE 70 MG  CAPS (LISDEXAMFETAMINE DIMESYLATE) 1 daily as directed Prescriptions: VYVANSE 70 MG  CAPS (LISDEXAMFETAMINE DIMESYLATE) 1 daily as directed  #30 x 0   Entered and Authorized by:   Cindee Salt MD   Signed by:   Cindee Salt MD on 03/23/2010   Method used:   Print then Give to Patient   RxID:   [id number]

## 2011-01-15 NOTE — Progress Notes (Signed)
Summary: Need form filled out  Phone Note Call from Patient Call back at Home Phone [phone] Call back at Work Phone [phone]   Caller: Patient Call For: Cindee Salt MD Summary of Call: Form in your desk to be signed for patient asking for proof of physical, pt last physical was 02/12/2010. Initial call taken by: Mervin Hack CMA Duncan Dull),  October 30, 2010 3:24 PM  Follow-up for Phone Call        form signed Follow-up by: Cindee Salt MD,  October 31, 2010 8:11 AM  Additional Follow-up for Phone Call Additional follow up Details #1::        Spoke with patient and advised form ready for pick-up, form also scanned.  Additional Follow-up by: Mervin Hack CMA (AAMA),  October 31, 2010 8:21 AM

## 2011-01-17 NOTE — Progress Notes (Signed)
Summary: refill request for vyvanse  Phone Note Refill Request Call back at [phone] Message from:  Patient  Refills Requested: Medication #1:  VYVANSE 70 MG  CAPS 1 daily as directed Please call pt when ready.  Initial call taken by: Lowella Petties CMA, AAMA,  January 07, 2011 2:29 PM  Follow-up for Phone Call        Rx written Follow-up by: Cindee Salt MD,  January 07, 2011 2:44 PM  Additional Follow-up for Phone Call Additional follow up Details #1::        Spoke with patient and advised rx ready for pick-up  Additional Follow-up by: Mervin Hack CMA Duncan Dull),  January 07, 2011 3:21 PM    Prescriptions: VYVANSE 70 MG  CAPS (LISDEXAMFETAMINE DIMESYLATE) 1 daily as directed  #30 x 0   Entered and Authorized by:   Cindee Salt MD   Signed by:   Cindee Salt MD on 01/07/2011   Method used:   Print then Give to Patient   RxID:   [id number]

## 2011-01-17 NOTE — Progress Notes (Signed)
Summary: refill request for vyvanse  Phone Note Refill Request Call back at Work Phone [phone] Message from:  Patient  Refills Requested: Medication #1:  VYVANSE 70 MG  CAPS 1 daily as directed Please call when ready.  Initial call taken by: Lowella Petties CMA, AAMA,  November 30, 2010 8:13 AM    Prescriptions: VYVANSE 70 MG  CAPS (LISDEXAMFETAMINE DIMESYLATE) 1 daily as directed  #30 x 0   Entered and Authorized by:   Ruthe Mannan MD   Signed by:   Ruthe Mannan MD on 11/30/2010   Method used:   Print then Give to Patient   RxID:   [id number]   Appended Document: refill request for vyvanse Left message on cell phone voicemail, Rx ready for pick up will be left at front desk.

## 2011-01-17 NOTE — Assessment & Plan Note (Signed)
Summary: CONGESTION,COUGH/CLE   Vital Signs:  Patient profile:   40 year old male Weight:      240 pounds O2 Sat:      98 % on Room air Temp:     98.8 degrees F oral Pulse rate:   103 / minute Pulse rhythm:   regular Resp:     14 per minute BP sitting:   150 / 100  (left arm) Cuff size:   large  Vitals Entered By: Mervin Hack CMA Duncan Dull) (December 05, 2010 12:42 PM)  O2 Flow:  Room air CC: congestion    History of Present Illness: Has been sick for 3-4 weeks intermittent over this time seems to be worsening now terrible night last night---- work was tough (fumes, etc)  May have had some low grade fever some sweats in bed Mild DOE at work Lots of cough---intermittent Gianino and yellow sputum  some head congestion some nasal drainage and PND--also purulent uses the zyrtec all the time no other meds for this  No sore throat some ear pain last night--brief  Allergies: 1)  Adderall (Amphetamine-Dextroamphetamine)  Past History:  Past medical, surgical, family and social histories (including risk factors) reviewed for relevance to current acute and chronic problems.  Past Medical History: Reviewed history from 11/05/2007 and no changes required. Allergic rhinitis Anxiety Hyperlipidemia ADHD  Past Surgical History: Reviewed history from 08/08/2010 and no changes required. Diverticulitis  1/07, 5/11  Family History: Reviewed history from 02/09/2009 and no changes required. Father: Alive Mother: Died at age 12, complications diabetes,?MI Siblings: 2 brothers, 1 sister, good health Mat GM died of Altzheimers Strong FH of prostate cancer Mat GF, uncle also died of MIs  Social History: Reviewed history from 02/12/2010 and no changes required. 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  NAD Head:  no sinus tenderness Ears:  R ear normal and L ear normal.   Nose:  moderate inflammation and swelling Mouth:  no erythema and no exudates.   Neck:  supple, no masses, and no cervical lymphadenopathy.   Lungs:  normal respiratory effort, no intercostal retractions, no accessory muscle use, normal breath sounds, no crackles, and no wheezes.     Impression & Recommendations:  Problem # 1:  SINUSITIS - ACUTE-NOS (ICD-461.9) Assessment New  clearly seems to have secondary bacterial infeciton will treat with augmentin analgesics  His updated medication list for this problem includes:    Amoxicillin-pot Clavulanate 875-125 Mg Tabs (Amoxicillin-pot clavulanate) .Marland Kitchen... 1 tab by mouth two times a day with food for sinus infection  Complete Medication List: 1)  Vyvanse 70 Mg Caps (Lisdexamfetamine dimesylate) .Marland Kitchen.. 1 daily as directed 2)  Aleve 220 Mg Tabs (Naproxen sodium) .... 2 pills twice daily 3)  Flexeril 10 Mg Tabs (Cyclobenzaprine hcl) .... Take one three times a day as needed muscle spasm 4)  Amoxicillin-pot Clavulanate 875-125 Mg Tabs (Amoxicillin-pot clavulanate) .Marland Kitchen.. 1 tab by mouth two times a day with food for sinus infection 5)  Tramadol Hcl 50 Mg Tabs (Tramadol hcl) .Marland Kitchen.. 1-2 tabs at night to suppress cough  Patient Instructions: 1)  Please call next week if no better 2)  Please keep March appt Prescriptions: TRAMADOL HCL 50 MG TABS (TRAMADOL HCL) 1-2 tabs at night to suppress cough  #  30 x 0   Entered and Authorized by:   Cindee Salt MD   Signed by:   Cindee Salt MD on 12/05/2010   Method used:   Elect[facility] to        [facility]  [address] [id number] (retail[facility]       [address]       [place], [place]  [address]       Ph: [phone] or [phone]       Fax: [phone]   RxID:   [id number] AMOXICILLIN-POT CLAVULANATE 875-125 MG TABS (AMOXICILLIN-POT CLAVULANATE) 1 tab by mouth two  times a day with food for sinus infection  #20 x 0   Entered and Authorized by:   Cindee Salt MD   Signed by:   Cindee Salt MD on 12/05/2010   Method used:   [facility]        [facility]  [address] [id number]       [address]       [place], [place]  [address]       Ph: [phone] or [phone]       Fax: [phone]   RxID:   [id number]    Orders Added: 1)  Est. Patient Level III [76160]    Current Allergies (reviewed today): ADDERALL (AMPHETAMINE-DEXTROAMPHETAMINE)

## 2011-01-17 NOTE — Letter (Signed)
Summary: [facility]  [facility]   Imported By: Lanelle Bal 01/03/2011 09:09:58  _____________________________________________________________________  External Attachment:    Type:   Image     Comment:   External Document  Appended Document: [facility] planning left shoulder surgery

## 2011-01-19 ENCOUNTER — Encounter: Payer: Self-pay | Admitting: Internal Medicine

## 2011-02-14 ENCOUNTER — Telehealth: Payer: Self-pay | Admitting: Internal Medicine

## 2011-02-14 ENCOUNTER — Encounter: Payer: Self-pay | Admitting: Family Medicine

## 2011-02-14 ENCOUNTER — Ambulatory Visit (INDEPENDENT_AMBULATORY_CARE_PROVIDER_SITE_OTHER): Payer: Commercial Managed Care - PPO | Admitting: Family Medicine

## 2011-02-14 DIAGNOSIS — K5732 Diverticulitis of large intestine without perforation or abscess without bleeding: Secondary | ICD-10-CM

## 2011-02-15 ENCOUNTER — Telehealth: Payer: Self-pay | Admitting: Family Medicine

## 2011-02-21 NOTE — Progress Notes (Signed)
Summary: refill request for vyvanse  Phone Note Refill Request Call back at [phone] Message from:  Patient  Refills Requested: Medication #1:  VYVANSE 70 MG  CAPS 1 daily as directed Please call when ready.    Initial call taken by: Lowella Petties CMA, AAMA,  February 14, 2011 12:42 PM  Follow-up for Phone Call        Rx written Follow-up by: Cindee Salt MD,  February 14, 2011 12:46 PM  Additional Follow-up for Phone Call Additional follow up Details #1::        left message on machine that rx ready for pick-up  Additional Follow-up by: DeShannon Smith CMA Duncan Dull),  February 14, 2011 1:51 PM    Prescriptions: VYVANSE 70 MG  CAPS (LISDEXAMFETAMINE DIMESYLATE) 1 daily as directed  #30 x 0   Entered and Authorized by:   Cindee Salt MD   Signed by:   Cindee Salt MD on 02/14/2011   Method used:   Print then Give to Patient   RxID:   [id number]

## 2011-02-21 NOTE — Progress Notes (Signed)
Summary: cipro called to pharmacy  Phone Note Call from Patient   Caller: Patient Summary of Call: Pt was seen yesterday, states cipro was to have been called to pharmacy, it was not.  I called it in as 500 mg's twice a day times one week, as per chart note.  Called to [facility]. Initial call taken by: Lowella Petties CMA, AAMA,  February 15, 2011 11:59 AM  Follow-up for Phone Call        Thank you. Follow-up by: Shaune Leeks MD,  February 15, 2011 4:11 PM    New/Updated Medications: CIPRO 500 MG TABS (CIPROFLOXACIN HCL) take one by mouth twice a day x one week  Prior Medications: VYVANSE 70 MG  CAPS (LISDEXAMFETAMINE DIMESYLATE) 1 daily as directed ALEVE 220 MG TABS (NAPROXEN SODIUM) 2 pills twice daily FLEXERIL 10 MG TABS (CYCLOBENZAPRINE HCL) take one three times a day as needed muscle spasm TRAMADOL HCL 50 MG TABS (TRAMADOL HCL) 1-2 tabs at night to suppress cough Current Allergies: ADDERALL (AMPHETAMINE-DEXTROAMPHETAMINE)

## 2011-02-21 NOTE — Assessment & Plan Note (Signed)
Summary: DIVERTICULITIS/CLE   Vital Signs:  Patient profile:   40 year old male Weight:      242.25 pounds Temp:     99.0 degrees F oral Pulse rate:   84 / minute Pulse rhythm:   regular BP sitting:   120 / 90  (left arm) Cuff size:   regular  Vitals Entered By: Sydell Axon LPN (February 13, 4097 4:02 PM) CC: ? Diverticulitis, abd pain   History of Present Illness: Pt here for abd pain. He has had diverticulitis two, maybe three times in the past. Thre first time was at [facility] ER and had a CT scan done and was admitted for three days and was given IV ABs and more to take when he got home.  He has a CT on the chart 2007 with findings c/w diverticulitis. He has had LLQ pain since yesterday. It is a pain he cannot touch or make better. It is grippy and cycles. It is LLQ. His last BM was this AM and was nml. He has not had N/V except N when the pain cycles worst. He does not think he has had a fever. He does not follow a prudent Divertic diet and has not tried fiber.  Problems Prior to Update: 1)  Sinusitis - Acute-nos  (ICD-461.9) 2)  Lumbar Strain  (ICD-847.2) 3)  Rotator Cuff Syndrome, Left  (ICD-726.10) 4)  Shoulder Pain, Left  (ICD-719.41) 5)  Preventive Health Care  (ICD-V70.0) 6)  Attention Deficit Disorder  (ICD-314.00) 7)  Hyperlipidemia  (ICD-272.4) 8)  Anxiety  (ICD-300.00) 9)  Allergic Rhinitis  (ICD-477.9)  Medications Prior to Update: 1)  Vyvanse 70 Mg  Caps (Lisdexamfetamine Dimesylate) .Marland Kitchen.. 1 Daily As Directed 2)  Aleve 220 Mg Tabs (Naproxen Sodium) .... 2 Pills Twice Daily 3)  Flexeril 10 Mg Tabs (Cyclobenzaprine Hcl) .... Take One Three Times A Day As Needed Muscle Spasm 4)  Tramadol Hcl 50 Mg Tabs (Tramadol Hcl) .Marland Kitchen.. 1-2 Tabs At Night To Suppress Cough  Allergies: 1)  Adderall (Amphetamine-Dextroamphetamine)  Physical Exam  General:  Well-developed,well-nourished,in no acute distress; alert,appropriate and cooperative throughout examination,  nontoxic. Head:  no sinus tenderness Lungs:  normal respiratory effort, no intercostal retractions, no accessory muscle use, normal breath sounds, no crackles, and no wheezes.   Heart:  normal rate, regular rhythm, no murmur, and no gallop.   Abdomen:  Bowel sounds positive,abdomen soft and tender without masses in the LLQ, no organomegaly or hernias noted. No rebound or percussive tenderness. Obturator and heel tap negative.   Impression & Recommendations:  Problem # 1:  DIVERTICULITIS, ACUTE, PRESUMED (ICD-562.11) Assessment Deteriorated  Recurrent, acute beginning yesterday. Start Cipro 500 two times a day for one week.  If sxs don't improve after three days, add Flagyl 250 four times a day and extend Cipro the same amt of time. RTC if sxs worsen or to ER for eval and poss admission.  Complete Medication List: 1)  Vyvanse 70 Mg Caps (Lisdexamfetamine dimesylate) .Marland Kitchen.. 1 daily as directed 2)  Aleve 220 Mg Tabs (Naproxen sodium) .... 2 pills twice daily 3)  Flexeril 10 Mg Tabs (Cyclobenzaprine hcl) .... Take one three times a day as needed muscle spasm 4)  Tramadol Hcl 50 Mg Tabs (Tramadol hcl) .Marland Kitchen.. 1-2 tabs at night to suppress cough 5)  Flagyl 250 Mg Tabs (Metronidazole) .... One tab by mouth four times a day.  Patient Instructions: 1)  RTC or call if sxs don't resolve. Prescriptions: FLAGYL 250 MG TABS (  METRONIDAZOLE) one tab by mouth four times a day.  #28 x 0   Entered and Authorized by:   Shaune Leeks MD   Signed by:   Shaune Leeks MD on 02/14/2011   Method used:   Print then Give to Patient   RxID:   [id number]    Orders Added: 1)  Est. Patient Level III [14782]    Current Allergies (reviewed today): ADDERALL (AMPHETAMINE-DEXTROAMPHETAMINE)

## 2011-03-06 ENCOUNTER — Encounter: Payer: Self-pay | Admitting: Internal Medicine

## 2011-03-20 ENCOUNTER — Other Ambulatory Visit: Payer: Self-pay | Admitting: *Deleted

## 2011-03-20 MED ORDER — LISDEXAMFETAMINE DIMESYLATE 70 MG PO CAPS: 70.0000 mg | ORAL_CAPSULE | ORAL | Status: DC

## 2011-03-20 NOTE — Telephone Encounter (Signed)
Left message on machine that rx is ready for pickup  

## 2011-03-27 ENCOUNTER — Encounter: Payer: Self-pay | Admitting: Internal Medicine

## 2011-03-27 ENCOUNTER — Ambulatory Visit (INDEPENDENT_AMBULATORY_CARE_PROVIDER_SITE_OTHER): Payer: Self-pay | Admitting: Internal Medicine

## 2011-03-27 VITALS — BP 128/80 | HR 74 | Temp 98.4°F | Ht 71.0 in | Wt 239.0 lb

## 2011-03-27 DIAGNOSIS — Z0000 Encounter for general adult medical examination without abnormal findings: Secondary | ICD-10-CM | POA: Insufficient documentation

## 2011-03-27 DIAGNOSIS — M719 Bursopathy, unspecified: Secondary | ICD-10-CM

## 2011-03-27 DIAGNOSIS — F988 Other specified behavioral and emotional disorders with onset usually occurring in childhood and adolescence: Secondary | ICD-10-CM

## 2011-03-27 DIAGNOSIS — M67919 Unspecified disorder of synovium and tendon, unspecified shoulder: Secondary | ICD-10-CM

## 2011-03-27 DIAGNOSIS — S335XXA Sprain of ligaments of lumbar spine, initial encounter: Secondary | ICD-10-CM

## 2011-03-27 MED ORDER — CYCLOBENZAPRINE HCL 10 MG PO TABS: 10.0000 mg | ORAL_TABLET | Freq: Three times a day (TID) | ORAL | Status: DC | PRN

## 2011-03-27 NOTE — Progress Notes (Signed)
Subjective:    Patient ID: John Adkins, male    DOB: 1971/08/29, 40 y.o.   MRN: [id number]  HPI Diverticulitis has settled down 2 spells this year but improved Trying to be careful with diet  Still satisfied with the med Uses mostly for work or other times that may require sustained attention Stress with 40 year old--concerned about Asperger's  Ongoing shoulder issues Delaying surgery for repair Gets along for now  Some hip pain on right May have strained it walking up a hill Plans to retry the flexeril--Rx sent Ongoing back issues--intermittent Better when he skates playing hockey--works his core  Past Medical History  Diagnosis Date  . Allergy   . Anxiety   . Hyperlipidemia   . ADHD (attention deficit hyperactivity disorder)     Past Surgical History  Procedure Date  . Colon surgery     Family History  Problem Relation Age of Onset  . Diabetes Mother   . Alzheimer's disease Maternal Grandmother   . Heart disease Maternal Grandfather     History   Social History  . Marital Status: Married    Spouse Name: N/A    Number of Children: 2  . Years of Education: N/A   Occupational History  . Buyer, retail   Social History Main Topics  . Smoking status: Never Smoker   . Smokeless tobacco: Current User    Types: Chew  . Alcohol Use: Not on file  . Drug Use: Not on file  . Sexually Active: Not on file   Other Topics Concern  . Not on file   Social History Narrative  . No narrative on file   Review of Systems  Constitutional: Negative for fatigue and unexpected weight change.       Some fluctuation of weight--being careful again Wears seat belt  HENT: Positive for rhinorrhea and postnasal drip. Negative for hearing loss and tinnitus.        Uses claritin prn   Eyes: Negative for itching and visual disturbance.       No vision loss or diplopia  Respiratory: Negative for cough, chest tightness and shortness of breath.     Cardiovascular: Negative for chest pain, palpitations and leg swelling.  Gastrointestinal: Negative for nausea, vomiting, abdominal pain and blood in stool.       No heartburn  Genitourinary: Negative for dysuria, urgency, decreased urine volume and difficulty urinating.       No ED  Musculoskeletal: Positive for back pain and arthralgias. Negative for joint swelling and gait problem.  Skin: Negative for rash.       Chronic vitiligo--no changes No suspicious lesions  Neurological: Negative for dizziness, syncope, weakness, numbness and headaches.  Hematological: Negative for adenopathy. Does not bruise/bleed easily.  Psychiatric/Behavioral: Positive for sleep disturbance. Negative for dysphoric mood. The patient is not nervous/anxious.        Readjusting to 1st shift--just changed jobs Hard for him to sleep at night       Objective:   Physical Exam  Constitutional: He is oriented to person, place, and time. He appears well-developed and well-nourished. No distress.  HENT:  Head: Normocephalic and atraumatic.  Right Ear: External ear normal.  Left Ear: External ear normal.  Mouth/Throat: Oropharynx is clear and moist. No oropharyngeal exudate.       TMs normal  Eyes: Conjunctivae and EOM are normal. Pupils are equal, round, and reactive to light.       Fundi benign  Neck: Normal range of motion. Neck supple. No thyromegaly present.  Cardiovascular: Normal rate, regular rhythm, normal heart sounds and intact distal pulses.  Exam reveals no gallop.   No murmur heard. Pulmonary/Chest: Effort normal and breath sounds normal. No respiratory distress. He has no rales.  Abdominal: Soft. He exhibits no mass. There is no tenderness.  Musculoskeletal: He exhibits no edema and no tenderness.  Lymphadenopathy:    He has no cervical adenopathy.    He has no axillary adenopathy.  Neurological: He is alert and oriented to person, place, and time. He exhibits normal muscle tone.       Normal  strength  Skin: Skin is warm. No rash noted.  Psychiatric: He has a normal mood and affect. His behavior is normal. Judgment and thought content normal.          Assessment & Plan:

## 2011-04-08 ENCOUNTER — Telehealth: Payer: Self-pay | Admitting: *Deleted

## 2011-04-08 MED ORDER — CIPROFLOXACIN HCL 500 MG PO TABS: 500.0000 mg | ORAL_TABLET | Freq: Two times a day (BID) | ORAL | Status: AC

## 2011-04-08 NOTE — Telephone Encounter (Signed)
rx sent to pharmacy, spoke with patient and advised results, pt states he will call next week

## 2011-04-08 NOTE — Telephone Encounter (Signed)
I am concerned that he would have another spell so soon. Okay to send Rx for cipro 500mg  bid  #14 x0 Set up appt for next week to review his status and decide if further action is needed (like CT scan)

## 2011-04-08 NOTE — Telephone Encounter (Signed)
Pt states he has had abd pain for a few days, thinks he has diverticulitis flare up. No known fever.  He is asking that antibiotic be called to [facility]- says he wouldn't be able to come to the office until Friday.  Please advise.

## 2011-04-30 ENCOUNTER — Other Ambulatory Visit: Payer: Self-pay | Admitting: *Deleted

## 2011-04-30 NOTE — Telephone Encounter (Signed)
Please call pt when ready.

## 2011-05-01 MED ORDER — LISDEXAMFETAMINE DIMESYLATE 70 MG PO CAPS: 70.0000 mg | ORAL_CAPSULE | ORAL | Status: DC

## 2011-05-01 NOTE — Telephone Encounter (Signed)
Left message on machine that rx ready for pick-up 

## 2011-05-03 NOTE — H&P (Signed)
John Adkins, John Adkins                 ACCOUNT NO.:  [id number]   MEDICAL RECORD NO.:  [id number]          PATIENT TYPE:  EMS   LOCATION:  ED                           FACILITY:  [facility]   PHYSICIAN:  Gordy Savers, M.D. LHCDATE OF BIRTH:  [birth year]   DATE OF ADMISSION:  12/29/2005  DATE OF DISCHARGE:                                HISTORY & PHYSICAL   CHIEF COMPLAINT:  Abdominal pain.   HISTORY OF PRESENT ILLNESS:  The patient is a 40 year old white gentleman  without significant past medical history.  He was well until approximately 4  days ago when he had the onset of some mild nonspecific lower abdominal  pain.  Symptoms waxed and waned.  He felt reasonably well yesterday, in  fact, played a round of golf.  At approximately 8:00 P.M. the day prior to  admission he had the onset of significant left lower quadrant pain.  This  persisted and he was seen in the emergency room for evaluation.  He was  noted to have a temperature of 101 degrees.  A CT scan of the abdomen  revealed findings consistent with acute diverticulitis and also slightly  worrisome for early abscess formation. Laboratory studies included a white  count of 13.7.  He is now admitted for further evaluation and treatment of  acute diverticulitis.   PAST MEDICAL HISTORY:  Fairly unremarkable.   PAST SURGICAL HISTORY:  He has had surgery involving his left ankle and also  right knee arthroscopic surgery.  He has had no overnight hospital  admissions.   MEDICATIONS:  He takes no chronic medications.   ALLERGIES:  He denies any allergies.   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 He has two preschool children.  His wife is a Pharm-D working third shift at [employer].  The patient chews tobacco but does not smoke and is planning to discontinue  this habit.   FAMILY HISTORY:  Father is in his early 47's and in remarkably good health.  Mother died at 72 this past spring from  complications of diabetes.  She also  had a history of diverticulosis.   PHYSICAL EXAMINATION:  VITAL SIGNS:  Temperature 100.8.  GENERAL APPEARANCE:  Examination revealed a well-developed, healthy, fit-  appearing male in no acute distress.  HEENT: Head and neck reveal normal fundi.  Ear, nose and throat clear.  NECK:  Supple.  There is no adenopathy.  CHEST:  Clear.  CARDIOVASCULAR:  Normal S1 and S2. No murmurs.  ABDOMEN:  Revealed active bowel sounds.  There is considerable left greater  than right lower abdominal tenderness.  Mild guarding noted.  External  genitalia normal.  EXTREMITIES: Negative with full peripheral pulses, no edema.   IMPRESSION:  Acute diverticulitis.   DISPOSITION:  The patient will be admitted to the hospital.  He will be  treated with parenteral antibiotic therapy as well as analgesics.  Will  consider a follow up abdominal CT scan in two to three days, depending upon  his clinical status, to exclude abscess formation.  ______________________________  Gordy Savers, M.D. [facility]     PFK/MEDQ  D:  12/29/2005  T:  12/30/2005  Job:  [id number]

## 2011-05-03 NOTE — Discharge Summary (Signed)
Adkins, John                 ACCOUNT NO.:  [id number]   MEDICAL RECORD NO.:  [id number]          PATIENT TYPE:  INP   LOCATION:  1618                         FACILITY:  [facility]   PHYSICIAN:  Rene Paci, M.D. LHCDATE OF BIRTH:  1971-07-18   DATE OF ADMISSION:  12/29/2005  DATE OF DISCHARGE:  12/31/2005                                 DISCHARGE SUMMARY   DISCHARGE DIAGNOSES:  1.  Acute diverticulitis, sigmoid, question contain microperforation.  2.  Tobacco abuse (2/dip at home).   DISCHARGE MEDICATIONS:  1.  Cipro 500 mg p.o. b.i.d. x8 days to complete 10-day course.  2.  Flagyl 500 p.o. t.i.d. x8 to complete 10-day course.  3.  Also a prescription for Vicodin 1-2 q.4 h. p.r.n. pain.  4.  Flexeril 10 mg 1 q.8 h. p.r.n. spasm.  5.  May continue with Wellbutrin XR 1500 mg daily as prior to admission.   POSSIBLE FOLLOWUP:  To be arranged with primary care physician, Dr. Gerlene Burdock  __________ at [facility].  Call for appointment in 1-2 weeks.  Also  instructed to return to the emergency room the following day if unable to  tolerate pills, food, drink, increased pain or increased fever prior to  scheduled appointment.   DISPOSITION:  Patient is discharged home where he lives with his wife and 12  children.   CONDITION ON DISCHARGE:  Medically stable. Afebrile for greater than 24  hours.  Tolerating regular diet as well as oral antibiotics and pain  control.  Condition on discharge is medically improved and stable.   HOSPITAL COURSE BY PROBLEM:  Acute sigmoid diverticulitis.  Patient is a  pleasant 40 year old gentleman who presented to the emergency room with left  lower quadrant pain and fever and was found by CT to have acute  diverticulitis and question of early abscess formation with localized  microperforation.  He was begun on IV Cipro and Flagyl as well as IV  Dilaudid as needed.  His fever and pain symptoms quickly improved.  He  tolerated a regular diet and  was anxious for discharge home after 48 hours  of IV antibiotics.  He was changed to oral equivalent of Cipro and Flagyl  and should he tolerate these as well as oral Vicodin on a p.r.n. basis for  his abdominal pain, he will be allowed discharge home with outpatient  followup to be scheduled with primary MD on conclusion of his antibiotic  therapy.  He has been instructed to return to the emergency room or call MD  if he has increased pain, fever, inability to tolerate his antibiotics.  GI  has not been consulted this admission but further evaluation as an  outpatient regarding episode of diverticulitis at such an early age may be a  consideration in the future.  We will defer this to his primary MD.      Rene Paci, M.D. [facility]  Electronically Signed    VL/MEDQ  D:  12/31/2005  T:  12/31/2005  Job:  [id number]

## 2011-05-17 ENCOUNTER — Ambulatory Visit: Payer: 59 | Admitting: Internal Medicine

## 2011-06-13 ENCOUNTER — Other Ambulatory Visit: Payer: Self-pay | Admitting: *Deleted

## 2011-06-13 MED ORDER — LISDEXAMFETAMINE DIMESYLATE 70 MG PO CAPS: 70.0000 mg | ORAL_CAPSULE | ORAL | Status: DC

## 2011-06-13 NOTE — Telephone Encounter (Signed)
Spoke with patient and advised results   

## 2011-06-13 NOTE — Telephone Encounter (Signed)
Please call pt when ready.

## 2011-06-14 ENCOUNTER — Inpatient Hospital Stay (INDEPENDENT_AMBULATORY_CARE_PROVIDER_SITE_OTHER)
Admission: RE | Admit: 2011-06-14 | Discharge: 2011-06-14 | Disposition: A | Discharge status: Home / Self Care | Payer: 59 | Source: Ambulatory Visit | Attending: Family Medicine | Admitting: Family Medicine

## 2011-06-14 DIAGNOSIS — K5732 Diverticulitis of large intestine without perforation or abscess without bleeding: Secondary | ICD-10-CM

## 2011-06-14 DIAGNOSIS — K5712 Diverticulitis of small intestine without perforation or abscess without bleeding: Secondary | ICD-10-CM

## 2011-06-14 LAB — POCT URINALYSIS DIP (DEVICE)
Bilirubin Urine: NEGATIVE
Glucose, UA: NEGATIVE mg/dL
Hgb urine dipstick: NEGATIVE
Leukocytes, UA: NEGATIVE
Nitrite: NEGATIVE
Protein, ur: NEGATIVE mg/dL
Urobilinogen, UA: 0.2 mg/dL (ref 0.0–1.0)
pH: 5.5 (ref 5.0–8.0)

## 2011-06-20 ENCOUNTER — Ambulatory Visit (INDEPENDENT_AMBULATORY_CARE_PROVIDER_SITE_OTHER): Payer: 59 | Admitting: Internal Medicine

## 2011-06-20 ENCOUNTER — Encounter: Payer: Self-pay | Admitting: Internal Medicine

## 2011-06-20 VITALS — BP 140/90 | HR 84 | Temp 98.5°F | Ht 70.0 in | Wt 234.0 lb

## 2011-06-20 DIAGNOSIS — K5792 Diverticulitis of intestine, part unspecified, without perforation or abscess without bleeding: Secondary | ICD-10-CM | POA: Insufficient documentation

## 2011-06-20 DIAGNOSIS — K5732 Diverticulitis of large intestine without perforation or abscess without bleeding: Secondary | ICD-10-CM

## 2011-06-20 NOTE — Progress Notes (Signed)
  Subjective:    Patient ID: John Adkins, male    DOB: [birth year], 40 y.o.   MRN: [id number]  HPI Seen in ER last week Diagnosed with diverticulitis at least 4-5 times Had 3 fairly significant times---it hurt quite a bit this time (terrible stabbing pain)  No fever No nausea or vomiting Bowels had been fine prior to episode  Feels back to normal now  Current Outpatient Prescriptions on File Prior to Visit  Medication Sig Dispense Refill  . cyclobenzaprine (FLEXERIL) 10 MG tablet Take 1 tablet (10 mg total) by mouth 3 (three) times daily as needed.  60 tablet  0  . lisdexamfetamine (VYVANSE) 70 MG capsule Take 1 capsule (70 mg total) by mouth every morning.  30 capsule  0  . naproxen sodium (ANAPROX) 220 MG tablet Take 220 mg by mouth 2 (two) times daily with meals.          Allergies  Allergen Reactions  . Adderall     REACTION: headache and didn't work well    Past Medical History  Diagnosis Date  . Allergy   . Anxiety   . Hyperlipidemia   . ADHD (attention deficit hyperactivity disorder)   . Diverticulitis     No past surgical history on file.  Family History  Problem Relation Age of Onset  . Diabetes Mother   . Alzheimer's disease Maternal Grandmother   . Heart disease Maternal Grandfather     History   Social History  . Marital Status: Married    Spouse Name: N/A    Number of Children: 2  . Years of Education: N/A   Occupational History  . Buyer, retail   Social History Main Topics  . Smoking status: Never Smoker   . Smokeless tobacco: Current User    Types: Chew  . Alcohol Use: Not on file  . Drug Use: Not on file  . Sexually Active: Not on file   Other Topics Concern  . Not on file   Social History Narrative  . No narrative on file   Review of Systems No cough No SOB Still dragging his feet on shoulder surgery---gets only brief help with cortisone shots     Objective:   Physical Exam  Constitutional: He appears  well-developed and well-nourished. No distress.  Abdominal: Soft. Bowel sounds are normal. He exhibits no mass. There is no tenderness.          Assessment & Plan:

## 2011-06-20 NOTE — Assessment & Plan Note (Signed)
Had fairly severe pain but responded quickly to antibiotics Discussed just waiting and treating prn vs considering surgery He will wait

## 2011-07-03 ENCOUNTER — Inpatient Hospital Stay (INDEPENDENT_AMBULATORY_CARE_PROVIDER_SITE_OTHER)
Admission: RE | Admit: 2011-07-03 | Discharge: 2011-07-03 | Disposition: A | Discharge status: Home / Self Care | Payer: 59 | Source: Ambulatory Visit | Attending: Emergency Medicine | Admitting: Emergency Medicine

## 2011-07-03 ENCOUNTER — Emergency Department (HOSPITAL_COMMUNITY)
Admission: EM | Admit: 2011-07-03 | Discharge: 2011-07-03 | Disposition: A | Discharge status: Home / Self Care | Payer: 59 | Attending: Emergency Medicine | Admitting: Emergency Medicine

## 2011-07-03 ENCOUNTER — Emergency Department (HOSPITAL_COMMUNITY): Payer: 59

## 2011-07-03 ENCOUNTER — Encounter (HOSPITAL_COMMUNITY): Payer: Self-pay | Admitting: Radiology

## 2011-07-03 DIAGNOSIS — R1032 Left lower quadrant pain: Secondary | ICD-10-CM | POA: Insufficient documentation

## 2011-07-03 DIAGNOSIS — R110 Nausea: Secondary | ICD-10-CM | POA: Insufficient documentation

## 2011-07-03 DIAGNOSIS — R509 Fever, unspecified: Secondary | ICD-10-CM

## 2011-07-03 DIAGNOSIS — F988 Other specified behavioral and emotional disorders with onset usually occurring in childhood and adolescence: Secondary | ICD-10-CM | POA: Insufficient documentation

## 2011-07-03 DIAGNOSIS — K5732 Diverticulitis of large intestine without perforation or abscess without bleeding: Secondary | ICD-10-CM | POA: Insufficient documentation

## 2011-07-03 LAB — URINE MICROSCOPIC-ADD ON

## 2011-07-03 LAB — URINALYSIS, ROUTINE W REFLEX MICROSCOPIC
Glucose, UA: NEGATIVE mg/dL
Hgb urine dipstick: NEGATIVE
Ketones, ur: 15 mg/dL — AB
Nitrite: POSITIVE — AB
Protein, ur: 30 mg/dL — AB
Specific Gravity, Urine: 1.028 (ref 1.005–1.030)
pH: 5.5 (ref 5.0–8.0)

## 2011-07-03 LAB — CBC
Hemoglobin: 16.7 g/dL (ref 13.0–17.0)
MCH: 30.8 pg (ref 26.0–34.0)
MCHC: 37.4 g/dL — ABNORMAL HIGH (ref 30.0–36.0)
MCV: 82.3 fL (ref 78.0–100.0)
Platelets: 169 10*3/uL (ref 150–400)
RBC: 5.42 MIL/uL (ref 4.22–5.81)
RDW: 13.2 % (ref 11.5–15.5)

## 2011-07-03 LAB — DIFFERENTIAL
Basophils Absolute: 0 10*3/uL (ref 0.0–0.1)
Basophils Relative: 0 % (ref 0–1)
Eosinophils Absolute: 0 10*3/uL (ref 0.0–0.7)
Eosinophils Relative: 0 % (ref 0–5)
Lymphocytes Relative: 8 % — ABNORMAL LOW (ref 12–46)
Lymphs Abs: 1.2 10*3/uL (ref 0.7–4.0)
Monocytes Absolute: 1.1 10*3/uL — ABNORMAL HIGH (ref 0.1–1.0)
Monocytes Relative: 7 % (ref 3–12)
Neutro Abs: 12.7 10*3/uL — ABNORMAL HIGH (ref 1.7–7.7)
Neutrophils Relative %: 85 % — ABNORMAL HIGH (ref 43–77)

## 2011-07-03 LAB — COMPREHENSIVE METABOLIC PANEL
AST: 13 U/L (ref 0–37)
Albumin: 4.2 g/dL (ref 3.5–5.2)
Alkaline Phosphatase: 64 U/L (ref 39–117)
BUN: 15 mg/dL (ref 6–23)
CO2: 28 mEq/L (ref 19–32)
Calcium: 10.1 mg/dL (ref 8.4–10.5)
Chloride: 100 mEq/L (ref 96–112)
Creatinine, Ser: 1.02 mg/dL (ref 0.50–1.35)
GFR calc Af Amer: >60 mL/min (ref >60)
GFR calc non Af Amer: >60 mL/min (ref >60)
Glucose, Bld: 94 mg/dL (ref 70–99)
Potassium: 4.7 mEq/L (ref 3.5–5.1)
Sodium: 138 mEq/L (ref 135–145)
Total Protein: 7.5 g/dL (ref 6.0–8.3)

## 2011-07-03 MED ORDER — IOHEXOL 300 MG/ML  SOLN
100.0000 mL | Freq: Once | INTRAMUSCULAR | Status: AC | PRN
  Administered 2011-07-03: 100 mL via INTRAVENOUS

## 2011-07-04 LAB — OCCULT BLOOD, POC DEVICE: Fecal Occult Bld: NEGATIVE

## 2011-07-08 ENCOUNTER — Encounter: Payer: Self-pay | Admitting: Internal Medicine

## 2011-07-08 ENCOUNTER — Ambulatory Visit (INDEPENDENT_AMBULATORY_CARE_PROVIDER_SITE_OTHER): Payer: 59 | Admitting: Internal Medicine

## 2011-07-08 VITALS — BP 140/90 | HR 93 | Temp 99.0°F | Ht 70.0 in | Wt 229.0 lb

## 2011-07-08 DIAGNOSIS — K5792 Diverticulitis of intestine, part unspecified, without perforation or abscess without bleeding: Secondary | ICD-10-CM

## 2011-07-08 DIAGNOSIS — K5732 Diverticulitis of large intestine without perforation or abscess without bleeding: Secondary | ICD-10-CM

## 2011-07-08 NOTE — Patient Instructions (Signed)
Please set up the surgery appointment

## 2011-07-08 NOTE — Assessment & Plan Note (Addendum)
Multiple quickly recurrent spells ER records reviewed Severe pain this time He is ready and I highly recommended surgical resection of the abnormal sigmoid Will proceed with consultation

## 2011-07-08 NOTE — Progress Notes (Signed)
  Subjective:    Patient ID: John Adkins, male    DOB: 1971-05-02, 40 y.o.   MRN: [id number]  HPI Had felt fine then suddenly hit again with abdominal pain Had to be seen in ER  CT scan confirmed diverticulitis Got dilaudid for pain in urgent care then sent to ER Has been using 2 percocet at a time wasn't helping Today is the first time he is having some relief  No fever Has been on augmentin for this--has about 5 more days left Appetite is off--just crackers and peanut butter Bowels have started up again---slight loose stool last night and this AM  Current Outpatient Prescriptions on File Prior to Visit  Medication Sig Dispense Refill  . cyclobenzaprine (FLEXERIL) 10 MG tablet Take 1 tablet (10 mg total) by mouth 3 (three) times daily as needed.  60 tablet  0  . HYDROcodone-acetaminophen (NORCO) 5-325 MG per tablet Take 1-2 tablets by mouth every 6 (six) hours as needed.        Marland Kitchen lisdexamfetamine (VYVANSE) 70 MG capsule Take 1 capsule (70 mg total) by mouth every morning.  30 capsule  0  . naproxen sodium (ANAPROX) 220 MG tablet Take 220 mg by mouth 2 (two) times daily with meals.          Allergies  Allergen Reactions  . Adderall     REACTION: headache and didn't work well    Past Medical History  Diagnosis Date  . Allergy   . Anxiety   . Hyperlipidemia   . ADHD (attention deficit hyperactivity disorder)   . Diverticulitis     No past surgical history on file.  Family History  Problem Relation Age of Onset  . Diabetes Mother   . Alzheimer's disease Maternal Grandmother   . Heart disease Maternal Grandfather     History   Social History  . Marital Status: Married    Spouse Name: N/A    Number of Children: 2  . Years of Education: N/A   Occupational History  . Buyer, retail   Social History Main Topics  . Smoking status: Never Smoker   . Smokeless tobacco: Current User    Types: Chew  . Alcohol Use: Not on file  . Drug Use: Not on  file  . Sexually Active: Not on file   Other Topics Concern  . Not on file   Social History Narrative  . No narrative on file   Review of Systems No cough  No SOB     Objective:   Physical Exam  Constitutional: He appears well-developed and well-nourished. No distress.  Pulmonary/Chest: Effort normal and breath sounds normal. No respiratory distress. He has no wheezes. He has no rales.  Abdominal: Soft. Bowel sounds are normal. He exhibits no distension and no mass. There is no rebound and no guarding.       Very minimal tenderness--almost more in RLQ than left          Assessment & Plan:

## 2011-08-05 ENCOUNTER — Other Ambulatory Visit: Payer: Self-pay | Admitting: *Deleted

## 2011-08-05 MED ORDER — LISDEXAMFETAMINE DIMESYLATE 70 MG PO CAPS: 70.0000 mg | ORAL_CAPSULE | ORAL | Status: DC

## 2011-08-05 NOTE — Telephone Encounter (Signed)
vyvanse refill, call when ready

## 2011-08-05 NOTE — Telephone Encounter (Signed)
Patient notified that rx is up front and ready for pickup. 

## 2011-08-07 ENCOUNTER — Other Ambulatory Visit (INDEPENDENT_AMBULATORY_CARE_PROVIDER_SITE_OTHER): Payer: Self-pay | Admitting: General Surgery

## 2011-08-07 ENCOUNTER — Ambulatory Visit (INDEPENDENT_AMBULATORY_CARE_PROVIDER_SITE_OTHER): Payer: Commercial Managed Care - PPO | Admitting: Surgery

## 2011-08-07 ENCOUNTER — Encounter (INDEPENDENT_AMBULATORY_CARE_PROVIDER_SITE_OTHER): Payer: Self-pay | Admitting: Surgery

## 2011-08-07 ENCOUNTER — Telehealth (INDEPENDENT_AMBULATORY_CARE_PROVIDER_SITE_OTHER): Payer: Self-pay | Admitting: General Surgery

## 2011-08-07 VITALS — BP 132/88 | HR 60 | Temp 97.6°F | Ht 71.0 in | Wt 229.5 lb

## 2011-08-07 DIAGNOSIS — K5792 Diverticulitis of intestine, part unspecified, without perforation or abscess without bleeding: Secondary | ICD-10-CM

## 2011-08-07 DIAGNOSIS — K5732 Diverticulitis of large intestine without perforation or abscess without bleeding: Secondary | ICD-10-CM

## 2011-08-07 NOTE — Patient Instructions (Signed)
Avoid straining that increases abdominal pressure to avoid recurrence of diverticulitis Obtain barium enema Avoid peanuts

## 2011-08-07 NOTE — Progress Notes (Signed)
Subjective:     Patient ID: John Adkins, male   DOB: 21-Jan-1971, 40 y.o.   MRN: [id number]  HPI  John Adkins presents today for discussion of surgery for diverticulitis. He is a 40 year old white male who works as an Solicitor at [employer]. He describes multiple attacks of left lower quadrant pain. He has associated him with peanut ingestion. Interestingly, he noted his first attack with pressure when doing leg presses. He is a man who works out and maintains his fitness.  I discussed laparoscopically-assisted sigmoid colectomy in detail indicated the risk and benefits of the procedure. He was not interested in treating this medically since he's been taking a lot of antibiotics and his symptoms tended to wax and Lane. He has never had a barium enema.  We'll proceed with scheduling him for a laparoscopically assisted sigmoid colectomy. In the meantime we'll obtain a barium enema to map his colon. CT scan report:  Sigmoid diverticulitis with diffuse inflammation but  without drainable abscess. No free intraperitoneal air. This  diverticulitis is in a similar location to the 2007 diverticulitis.  No surrounding adenopathy to suggest underlying tumor. This  possibility can be addressed on follow-up after acute episode has  clear.   2 booklets on colon surgery in general and diverticulitis specifically were given to him for his review. Current outpatient prescriptions:cyclobenzaprine (FLEXERIL) 10 MG tablet, Take 1 tablet (10 mg total) by mouth 3 (three) times daily as needed., Disp: 60 tablet, Rfl: 0;  lisdexamfetamine (VYVANSE) 70 MG capsule, Take 1 capsule (70 mg total) by mouth every morning., Disp: 30 capsule, Rfl: 0;  HYDROcodone-acetaminophen (NORCO) 5-325 MG per tablet, Take 1-2 tablets by mouth every 6 (six) hours as needed.  , Disp: , Rfl:  naproxen sodium (ANAPROX) 220 MG tablet, Take 220 mg by mouth 2 (two) times daily with meals.  , Disp: , Rfl:  Past Medical History    Diagnosis Date  . Allergy   . Anxiety   . Hyperlipidemia   . ADHD (attention deficit hyperactivity disorder)   . Diverticulitis   . Abdominal pain   . Wears glasses     wears contacts   Past Surgical History  Procedure Date  . Knee surgery 1992    right   . Ankle surgery 1993    left ankle       Review of Systems  Constitutional: Negative.   HENT: Negative.   Eyes: Negative.   Respiratory: Negative.   Cardiovascular: Negative.   Gastrointestinal: Positive for abdominal pain. Negative for constipation.  Musculoskeletal:       Torn biceps tendon on the left shoulder  Skin: Negative.   Neurological: Negative.   Hematological: Negative.   Psychiatric/Behavioral: Negative.        Objective:   Physical Exam  Constitutional: He is oriented to person, place, and time. He appears well-developed and well-nourished.  HENT:  Head: Normocephalic and atraumatic.  Eyes: Conjunctivae and EOM are normal. Pupils are equal, round, and reactive to light.  Neck: Normal range of motion. Neck supple.  Cardiovascular: Normal rate, regular rhythm and normal heart sounds.   Pulmonary/Chest: Effort normal and breath sounds normal.  Abdominal: Soft. Bowel sounds are normal. There is no tenderness.  Musculoskeletal:       Limited ROM of left shoulder  Neurological: He is alert and oriented to person, place, and time.  Skin: Skin is warm and dry.  Psychiatric: He has a normal mood and affect. His behavior is normal.  Judgment and thought content normal.   Filed Vitals:   08/07/11 1327  BP: 132/88  Pulse: 60  Temp: 97.6 F (36.4 C)       Assessment:     Recurrent diverticulitis.  No history noted of abscess.    Plan:     Laparoscopically assisted sigmoid colectomy

## 2011-08-07 NOTE — Telephone Encounter (Signed)
Contacted [facility] and called out a 1 day colon prep, Nulytley and ErythromycinBase 1gm #3- Neomycin 1gm #3 take 1 each at 3 pm 6pm and 10pm

## 2011-08-13 ENCOUNTER — Ambulatory Visit
Admission: RE | Admit: 2011-08-13 | Discharge: 2011-08-13 | Disposition: A | Discharge status: Home / Self Care | Payer: 59 | Source: Ambulatory Visit | Attending: Surgery | Admitting: Surgery

## 2011-08-13 DIAGNOSIS — K5792 Diverticulitis of intestine, part unspecified, without perforation or abscess without bleeding: Secondary | ICD-10-CM

## 2011-08-17 HISTORY — PX: OTHER SURGICAL HISTORY: SHX169

## 2011-09-02 ENCOUNTER — Other Ambulatory Visit (INDEPENDENT_AMBULATORY_CARE_PROVIDER_SITE_OTHER): Payer: Self-pay | Admitting: Surgery

## 2011-09-02 ENCOUNTER — Encounter (HOSPITAL_COMMUNITY): Payer: 59

## 2011-09-02 LAB — CBC
Hemoglobin: 15.7 g/dL (ref 13.0–17.0)
MCH: 29.9 pg (ref 26.0–34.0)
MCHC: 36.2 g/dL — ABNORMAL HIGH (ref 30.0–36.0)
RDW: 13.2 % (ref 11.5–15.5)

## 2011-09-02 LAB — BASIC METABOLIC PANEL
Calcium: 9.6 mg/dL (ref 8.4–10.5)
GFR calc Af Amer: >60 mL/min (ref >60)
GFR calc non Af Amer: >60 mL/min (ref >60)
Glucose, Bld: 88 mg/dL (ref 70–99)
Potassium: 3.9 mEq/L (ref 3.5–5.1)
Sodium: 140 mEq/L (ref 135–145)

## 2011-09-02 LAB — TYPE AND SCREEN: Antibody Screen: NEGATIVE

## 2011-09-02 LAB — SURGICAL PCR SCREEN: Staphylococcus aureus: NEGATIVE

## 2011-09-04 ENCOUNTER — Other Ambulatory Visit (INDEPENDENT_AMBULATORY_CARE_PROVIDER_SITE_OTHER): Payer: Self-pay | Admitting: Surgery

## 2011-09-04 ENCOUNTER — Inpatient Hospital Stay (HOSPITAL_COMMUNITY)
Admission: RE | Admit: 2011-09-04 | Discharge: 2011-09-09 | DRG: 331 | Disposition: A | Discharge status: Home / Self Care | Payer: 59 | Source: Ambulatory Visit | Attending: Surgery | Admitting: Surgery

## 2011-09-04 DIAGNOSIS — Z01812 Encounter for preprocedural laboratory examination: Secondary | ICD-10-CM

## 2011-09-04 DIAGNOSIS — K5732 Diverticulitis of large intestine without perforation or abscess without bleeding: Principal | ICD-10-CM | POA: Diagnosis present

## 2011-09-04 DIAGNOSIS — K5730 Diverticulosis of large intestine without perforation or abscess without bleeding: Secondary | ICD-10-CM

## 2011-09-04 DIAGNOSIS — F909 Attention-deficit hyperactivity disorder, unspecified type: Secondary | ICD-10-CM | POA: Diagnosis present

## 2011-09-04 DIAGNOSIS — F411 Generalized anxiety disorder: Secondary | ICD-10-CM | POA: Diagnosis present

## 2011-09-04 DIAGNOSIS — E785 Hyperlipidemia, unspecified: Secondary | ICD-10-CM | POA: Diagnosis present

## 2011-09-05 ENCOUNTER — Encounter: Payer: Self-pay | Admitting: Internal Medicine

## 2011-09-05 LAB — DIFFERENTIAL
Basophils Absolute: 0 10*3/uL (ref 0.0–0.1)
Basophils Relative: 0 % (ref 0–1)
Eosinophils Absolute: 0 10*3/uL (ref 0.0–0.7)
Eosinophils Relative: 0 % (ref 0–5)
Lymphocytes Relative: 4 % — ABNORMAL LOW (ref 12–46)
Lymphs Abs: 0.8 10*3/uL (ref 0.7–4.0)
Monocytes Absolute: 1 10*3/uL (ref 0.1–1.0)
Monocytes Relative: 5 % (ref 3–12)
Neutro Abs: 18.2 10*3/uL — ABNORMAL HIGH (ref 1.7–7.7)
Neutrophils Relative %: 91 % — ABNORMAL HIGH (ref 43–77)

## 2011-09-05 LAB — CBC
HCT: 40.5 % (ref 39.0–52.0)
MCH: 30 pg (ref 26.0–34.0)
MCV: 82.7 fL (ref 78.0–100.0)
RBC: 4.9 MIL/uL (ref 4.22–5.81)
WBC: 19.9 10*3/uL — ABNORMAL HIGH (ref 4.0–10.5)

## 2011-09-09 LAB — CBC
HCT: 43.7 % (ref 39.0–52.0)
Hemoglobin: 15.9 g/dL (ref 13.0–17.0)
MCH: 29.6 pg (ref 26.0–34.0)
MCHC: 36.4 g/dL — ABNORMAL HIGH (ref 30.0–36.0)
MCV: 81.2 fL (ref 78.0–100.0)
RDW: 13.3 % (ref 11.5–15.5)

## 2011-09-11 NOTE — Op Note (Signed)
John Adkins, John Adkins                 ACCOUNT NO.:  [id number]  MEDICAL RECORD NO.:  [id number]  LOCATION:  1539                         FACILITY:  [facility]  PHYSICIAN:  Thornton Park. Daphine Deutscher, MD  DATE OF BIRTH:  1971-01-13  DATE OF PROCEDURE:  09/04/2011 DATE OF DISCHARGE:                              OPERATIVE REPORT   PREOPERATIVE DIAGNOSES:  40 year old white male with; 1. Recurrent diverticulitis. 2. History of hyperlipidemia. 3. Attention deficit disorder. 4. Rotator cuff problems on the left side.  PROCEDURE:  Laparoscopically-assisted sigmoid colectomy with mobilization of splenic flexure.  SURGEON:  Thornton Park. Daphine Deutscher, M.D.  ASSISTANT:  Anselm Pancoast. Zachery Dakins, M.D.  ANESTHESIA:  General endotracheal.  FINDINGS:  Focus of acute diverticulitis in the pelvis and left sidewall.  DESCRIPTION OF PROCEDURE:  John Adkins was taken to room 1, on Wednesday, September 04, 2011, and given general anesthesia.  He was placed in the dorsal lithotomy position and was prepped widely with PCMX and draped sterilely.  Access to the abdomen was achieved through the left upper quadrant using 0 degree OptiView without difficulty.  This was done after time-out was performed.  Once in, the abdomen was insufflated and initial port was in the right upper quadrant.  Second port was placed in the midline below the umbilicus, a third placed above the umbilicus in the midline.  With insufflation, I then began mobilization of the sigmoid colon using a harmonic scalpel.  I mobilized the pelvis, put an extra port and over on the left side laterally and used that and then eventually placed a hand port in the lower midline through which I was able to go up and take the splenic flexure down and move it towards the midline and move the left descending colon toward the midline as well.  When completed, I was able to bring the specimen up to the hand assist port.  With that in place, I identified proximal distal  well beyond the area of the really acute inflammation.  I knew the man had some descending tics and he was apprised of that prior to procedure, made aware that there was nowhere he can get rid of all of his tics.  However, we resected this significant disease and did this stapling proximally and distally with a stapling device, dividing the ends and dividing the bowel and then going to the mesentery with Harmonic scalpel oversewing vessels with 2-0 silk suture ligatures.  Dr. Zachery Dakins scoped the patient below and found the remnant was about 20 cm up.  I elected to hand sew this and did so with an end-to-end technique cleaning off the ends and putting together with 3-0 silk pop offs seromuscularly with an inner layer running locking 4-0 PDS converting to canal Mayo.  Second layer of 3-0 silks were used.  When completed, I brought a drain in through the lateral port on the left, put it down on his pelvis.  Dr. Zachery Dakins scoped the patient again, clamped off above pressure, testing the anastomosis under water.  No leak was seen.  Good gas flow above the anastomosis.  Anastomosis appeared to be patent.  Sponge and needle counts were correct.  Lower midline incision  was closed with 2-0 Vicryl on the peritoneum.  The fascia was closed interrupted #1 Novofils.  Wound was irrigated.  __________ was injected in the fascia and the wound was closed with staples.  I reinflated the abdomen, everything looked to be in order.  No bleeding was seen and the drain looked to be in good position.  The patient tolerated the procedure well.  He was taken to recovery room in satisfactory condition.     Thornton Park Daphine Deutscher, MD     MBM/MEDQ  D:  09/04/2011  T:  09/04/2011  Job:  [id number]  cc:   Karie Schwalbe, MD Fax: [phone]  Electronically Signed by Luretha Murphy MD on 09/11/2011 07:33:07 AM

## 2011-09-11 NOTE — Discharge Summary (Signed)
  [name], [name]                 ACCOUNT NO.:  [id number]  MEDICAL RECORD NO.:  [id number]  LOCATION:  1539                         FACILITY:  [facility]  PHYSICIAN:  Thornton Park. Daphine Deutscher, MD  DATE OF BIRTH:  1971/04/03  DATE OF ADMISSION:  09/04/2011 DATE OF DISCHARGE:  09/09/2011                              DISCHARGE SUMMARY   ADMITTING DIAGNOSIS:  Recurrent diverticulitis.  PROCEDURE:  Laparoscopically assisted sigmoid colectomy with mobilization of splenic flexure.  COURSE IN HOSPITAL:  John Adkins is a 40 year old white male who underwent the above-mentioned operation.  He had some difficulty voiding and required catheterization.  He then voided okay after that.  On postop day #1, he was started on clear liquids only which he advanced slowly over the ensuing few days.  He began passing gas by postop day #4 and was advanced to a regular diet.  His IV fluids were diminished.  His JP drain just showed some serosanguineous drainage.  He was ready for discharge on postop day #5, September 09, 2011.  His JP was removed. Condition good.  Incisions were healing without evidence of infection. He was given a prescription for Percocet 5/325 to take for pain.  He will be followed up in the office in 2-3 weeks.  FINAL DIAGNOSIS:  On pathology showed diverticulosis, diverticulitis.     Thornton Park Daphine Deutscher, MD     MBM/MEDQ  D:  09/09/2011  T:  09/09/2011  Job:  [id number]  Electronically Signed by Luretha Murphy MD on 09/11/2011 07:33:10 AM

## 2011-09-16 HISTORY — PX: SHOULDER SURGERY: SHX246

## 2011-09-23 ENCOUNTER — Other Ambulatory Visit: Payer: Self-pay | Admitting: *Deleted

## 2011-09-23 MED ORDER — LISDEXAMFETAMINE DIMESYLATE 70 MG PO CAPS: 70.0000 mg | ORAL_CAPSULE | ORAL | Status: DC

## 2011-09-23 NOTE — Telephone Encounter (Signed)
Please call patient when ready. 

## 2011-09-23 NOTE — Telephone Encounter (Signed)
Left message on machine that rx is ready for pick-up, and it will be at our front desk.  

## 2011-09-25 ENCOUNTER — Encounter: Payer: Self-pay | Admitting: Internal Medicine

## 2011-09-25 ENCOUNTER — Ambulatory Visit (INDEPENDENT_AMBULATORY_CARE_PROVIDER_SITE_OTHER): Payer: 59 | Admitting: Internal Medicine

## 2011-09-25 VITALS — BP 139/91 | HR 94 | Temp 99.0°F | Ht 71.0 in | Wt 230.0 lb

## 2011-09-25 DIAGNOSIS — M67919 Unspecified disorder of synovium and tendon, unspecified shoulder: Secondary | ICD-10-CM

## 2011-09-25 DIAGNOSIS — F988 Other specified behavioral and emotional disorders with onset usually occurring in childhood and adolescence: Secondary | ICD-10-CM

## 2011-09-25 DIAGNOSIS — J069 Acute upper respiratory infection, unspecified: Secondary | ICD-10-CM | POA: Insufficient documentation

## 2011-09-25 NOTE — Assessment & Plan Note (Signed)
Stable on meds Will continue for work

## 2011-09-25 NOTE — Assessment & Plan Note (Signed)
Seems to be viral Mild sore throat--nothing to suggest strep

## 2011-09-25 NOTE — Progress Notes (Signed)
Subjective:    Patient ID: GEN CLAGG, male    DOB: [birth year], 40 y.o.   MRN: [id number]  HPI Had sigmoid colectomy Recovered well---laparascopic Hopefully won't have any more problems with diverticulitis  Planning proceeding with the shoulder surgery Chronic problem  Put back out from bending down to pick up sock  Having sore throat Got it from child 2 days ago No fever at home Some cough Has congestion, rhinorrhea No SOB  Satisfied with vyvanse Still uses it for work only pretty much Helps him concentrate for work  Current Outpatient Prescriptions on File Prior to Visit  Medication Sig Dispense Refill  . cyclobenzaprine (FLEXERIL) 10 MG tablet Take 1 tablet (10 mg total) by mouth 3 (three) times daily as needed.  60 tablet  0  . HYDROcodone-acetaminophen (NORCO) 5-325 MG per tablet Take 1-2 tablets by mouth every 6 (six) hours as needed.        Marland Kitchen lisdexamfetamine (VYVANSE) 70 MG capsule Take 1 capsule (70 mg total) by mouth every morning.  30 capsule  0    Allergies  Allergen Reactions  . Adderall     REACTION: headache and didn't work well    Past Medical History  Diagnosis Date  . Allergy   . Anxiety   . Hyperlipidemia   . ADHD (attention deficit hyperactivity disorder)   . Diverticulitis   . Abdominal pain   . Wears glasses     wears contacts    Past Surgical History  Procedure Date  . Knee surgery 1992    right   . Ankle surgery 1993    left ankle   . Sigmoid colectomy 9/12    Dr Oneida Arenas recurrent diverticulitis    Family History  Problem Relation Age of Onset  . Diabetes Mother   . Heart disease Mother   . Alzheimer's disease Maternal Grandmother   . Heart disease Maternal Grandfather   . Cancer Father     prostate cancer     History   Social History  . Marital Status: Married    Spouse Name: N/A    Number of Children: 2  . Years of Education: N/A   Occupational History  . Buyer, retail   Social  History Main Topics  . Smoking status: Never Smoker   . Smokeless tobacco: Current User    Types: Chew  . Alcohol Use: Yes  . Drug Use: No  . Sexually Active: Not on file   Other Topics Concern  . Not on file   Social History Narrative  . No narrative on file   Review of Systems Not sleeping great since put on 1st shift. Started 8 months ago occ borrows an [place] from [employer] and sometimes helps Does better on 3rd shift--not available now    Objective:   Physical Exam  Constitutional: He appears well-developed and well-nourished. No distress.  HENT:  Head: Normocephalic.  Right Ear: External ear normal.  Left Ear: External ear normal.  Mouth/Throat: Oropharynx is clear and moist. No oropharyngeal exudate.       No sinus tenderness Slight pharyngeal injection only  Neck: Normal range of motion. Neck supple.  Pulmonary/Chest: Effort normal and breath sounds normal. No respiratory distress. He has no wheezes. He has no rales.  Lymphadenopathy:    He has no cervical adenopathy.  Psychiatric: He has a normal mood and affect. His behavior is normal. Judgment and thought content normal.  Assessment & Plan:

## 2011-09-25 NOTE — Assessment & Plan Note (Signed)
May look to have surgery soon

## 2011-09-27 ENCOUNTER — Ambulatory Visit: Payer: 59 | Admitting: Internal Medicine

## 2011-10-07 ENCOUNTER — Encounter (HOSPITAL_BASED_OUTPATIENT_CLINIC_OR_DEPARTMENT_OTHER)
Admission: RE | Admit: 2011-10-07 | Discharge: 2011-10-07 | Disposition: A | Discharge status: Home / Self Care | Payer: 59 | Source: Ambulatory Visit | Attending: Orthopedic Surgery | Admitting: Orthopedic Surgery

## 2011-10-08 ENCOUNTER — Ambulatory Visit (HOSPITAL_BASED_OUTPATIENT_CLINIC_OR_DEPARTMENT_OTHER)
Admission: RE | Admit: 2011-10-08 | Discharge: 2011-10-08 | Disposition: A | Discharge status: Home / Self Care | Payer: 59 | Source: Ambulatory Visit | Attending: Orthopedic Surgery | Admitting: Orthopedic Surgery

## 2011-10-08 DIAGNOSIS — M719 Bursopathy, unspecified: Secondary | ICD-10-CM | POA: Insufficient documentation

## 2011-10-08 DIAGNOSIS — S43439A Superior glenoid labrum lesion of unspecified shoulder, initial encounter: Secondary | ICD-10-CM | POA: Insufficient documentation

## 2011-10-08 DIAGNOSIS — Z01810 Encounter for preprocedural cardiovascular examination: Secondary | ICD-10-CM | POA: Insufficient documentation

## 2011-10-08 DIAGNOSIS — Z01812 Encounter for preprocedural laboratory examination: Secondary | ICD-10-CM | POA: Insufficient documentation

## 2011-10-08 DIAGNOSIS — M67919 Unspecified disorder of synovium and tendon, unspecified shoulder: Secondary | ICD-10-CM | POA: Insufficient documentation

## 2011-10-19 NOTE — Op Note (Signed)
John Adkins, John Adkins                 ACCOUNT NO.:  [id number]  MEDICAL RECORD NO.:  [id number]  LOCATION:                                 FACILITY:  PHYSICIAN:  John Broom, MD    DATE OF BIRTH:  21-Feb-1971  DATE OF PROCEDURE:  10/08/2011 DATE OF DISCHARGE:                              OPERATIVE REPORT   PREOPERATIVE DIAGNOSES: 1. Left upper border subscapularis tear. 2. Likely superior labrum anterior and posterior tear.  POSTOPERATIVE DIAGNOSES: 1. Left shoulder upper border subscapularis tear. 2. Left shoulder type 2 superior labrum anterior and posterior tear. 3. Left shoulder partial-thickness articular-sided supraspinatus tear.  PROCEDURE PERFORMED: 1. Left shoulder arthroscopic subscapularis repair. 2. Left shoulder arthroscopic debridement of undersurface     supraspinatus tear, debridement of superior labrum anterior and     posterior tear, and arthroscopic biceps tenotomy. 3. Left shoulder subpectoral biceps tenodesis.  ATTENDING SURGEON:  John Broom, MD  ASSISTANT:  John Mayer, PA-C  COMPLICATIONS:  None.  DRAINS:  None.  SPECIMENS:  None.  ESTIMATED BLOOD LOSS:  Less than 10 mL.  INDICATION FOR SURGERY:  The patient is a 40 year old gentleman who I have followed now for over a year for left shoulder pain.  He has had multiple intra-articular injections which all relieved his pain for several months.  The pain subsequently kept returning.  He had anterior pain worse when trying to close a door.  He had findings on exam consistent with some likely labral pathology and also subscapularis symptoms.  He also had an MRI showing an upper border subscapularis tear with some changes in the long head biceps tendon.  We talked about surgical versus continued conservative management.  He wants to go ahead with surgical management understanding risks, benefits, and alternatives to the procedure including but not limited to, risk of stiffness, nonhealing,  and potential for incomplete pain relief.  OPERATIVE FINDINGS:  Examination under anesthesia demonstrated no stiffness or instability.  Diagnostic arthroscopy revealed a type 2 superior labral anterior to posterior tear with subluxation of the labrum into the joint.  He also had a tear of the upper rolled border of the subscapularis which was non- retracted.  The undersurface of the anterior supraspinatus also had some partial-thickness tearing.  Labrum was intact.  The cartilaginous joint surfaces were intact.  The biceps was managed with biceps tenotomy arthroscopically and debridement of superior labral tear.  The upper border subscapularis was repaired using one 5.5-mm PEEK Corkscrew anchor after preparing the tuberosity to a bleeding surface.  The undersurface supraspinatus tear was treated with debridement.  The subacromial space was examined, and there was no bursal sided tearing of the rotator cuff. He was not felt to have any signs of impingement, and therefore, no acromioplasty was performed.  PROCEDURE:  The patient was identified in the preoperative holding area where I personally marked the operative site after verifying site, side, and procedure with the patient.  He had an interscalene block given by the attending anesthesiologist and then was taken back to the operating room where general anesthesia was induced without complication.  He was placed in the beach chair position with all extremities carefully padded  in position.  Then head and neck carefully padded in position.  The left upper extremity was prepped and draped in a standard sterile fashion. The appropriate time-out procedure was carried out.  He did receive IV antibiotics.  A standard posterior portal was established and the arthroscope was introduced into the joint.  An anterior portal was then established with needle localization just above the subscapularis. Diagnostic arthroscopy was carried out with  findings as described above. John Adkins was introduced through the anterior portal and used to debride the superior labral tear back to a stable base as well as the upper border of the subscapularis and the articular-sided rotator cuff tear superiorly.  The rotator cuff superiorly was felt to be intact other than the partial-thickness tearing and not necessary for repair.  Given the upper border subscapularis tear requiring repair and the associated SLAP tear, biceps tenotomy was carried out and then the upper border subscapularis tear was repaired.  A smaller accessory portal was placed in the rotator interval from superolateral position and a larger working portal was placed anteriorly.  The tendon edge was debrided down to healthy-appearing tendon and the footprint where the tendon had pulled off was prepared with an ArthroCare and subsequently a bur down to bleeding bone.  One 5.5-mm PEEK anchor was placed through the anterior portal and the repair was then carried out using a crescent Suture Lasso to pass the sutures retrograde with one suture in a horizontal mattress configuration and the second suture in a simple configuration on the upper border.  This pulled the tendon over nicely to the prepared tuberosity and restored the normal morphology of the tendon.  The superior labral tear was cleaned up with the bur and ArthroCare.  The arthroscope was then moved into the subacromial space where the bursal surface of the cuff was carefully examined.  There was no partial- thickness tearing noted.  The coracoacromial ligament was intact and showed no signs of fraying indicating impingement.  The arthroscope was then removed and attention was turned anteriorly to the axillary crease where an approximately 3-cm incision was made in a vertical fashion. Dissection was carried down to subcutaneous tissues to the level of the fascia.  The inferior border of the pectoralis major was identified  and this plane was developed down to the humerus where the long head biceps was identified.  Retractors were placed medial and lateral around the humerus and the long head of biceps was then delivered out of the wound. The anterior surface of the humerus was prepared at the inferior portion of the bicipital groove and 2 small drill holes were made in the anterior humerus with one being just smaller than the size of the tendon proximally and the second being just large enough to allow passage of the crescent suture passer device approximately 1 cm distal to the first hole.  The tendon was prepared using the FiberLoop after measuring appropriate tension and the remaining tendon was cut and discarded.  The crescent suture passer was then used to pass the sutures from proximal to distal, and the tendon was then dunked in the proximal hole and was directly visualized to be intramedullary.  The sutures were then passed around either side of the tendon and tied over top of the tendon taking care not to strangulate the tendon.  The wound was then copiously irrigated with normal saline and subsequently closed in layers with 2-0 Vicryl in a deep dermal layer and Dermabond for skin closure.  The remaining  portals were closed with 3-0 nylon in an interrupted fashion. Sterile dressings were applied including Xeroform, 4x4s, ABD, and tape. The patient was then placed in a sling immobilizer, allowed to awaken from general anesthesia, transferred to stretcher, and taken to recovery room in stable condition.  POSTOPERATIVE PLAN:  He will be discharged home tonight with his family. He will follow up with me in 1 week.  He will remain in a sling until that time.  He will have Percocet for pain control.     John Broom, MD     JC/MEDQ  D:  10/08/2011  T:  10/09/2011  Job:  [id number]  Electronically Signed by John Adkins  on 10/19/2011 01:48:21 PM

## 2011-10-23 ENCOUNTER — Ambulatory Visit (INDEPENDENT_AMBULATORY_CARE_PROVIDER_SITE_OTHER): Payer: 59 | Admitting: Surgery

## 2011-10-23 DIAGNOSIS — Z9889 Other specified postprocedural states: Secondary | ICD-10-CM

## 2011-10-23 DIAGNOSIS — Z9049 Acquired absence of other specified parts of digestive tract: Secondary | ICD-10-CM | POA: Insufficient documentation

## 2011-10-23 NOTE — Progress Notes (Signed)
John Adkins 40 y.o.  There is no height or weight on file to calculate BMI.  Patient Active Problem List  Diagnoses  . HYPERLIPIDEMIA  . ANXIETY  . ATTENTION DEFICIT DISORDER  . ALLERGIC RHINITIS  . ROTATOR CUFF SYNDROME, LEFT  . LUMBAR STRAIN  . Routine general medical examination at a health care facility  . Acute upper respiratory infections of unspecified site  . S/P partial resection of colon    Allergies  Allergen Reactions  . Adderall     REACTION: headache and didn't work well    Past Surgical History  Procedure Date  . Knee surgery 1992    right   . Ankle surgery 1993    left ankle   . Sigmoid colectomy 9/12    Dr Oneida Arenas recurrent diverticulitis   [name], MD, MD 1. S/P partial resection of colon     John Adkins is doing well after a laparoscopically assisted sigmoid colectomy done on August 28, 2011 for symptomatic diverticulitis. His lower midline incision and his laparoscopic incisions are healing fine. He has had absolutely no problems with surgery. He should also had recent left shoulder surgery by Dr. Ave Filter.  I discussed the details of the surgery and told him I be happy seen again as needed in the future. Return p.r.n. Matt B. Daphine Deutscher, MD, [facility] [phone] beeper [phone]  10/23/2011 3:01 PM

## 2011-10-23 NOTE — Patient Instructions (Signed)
followup with Dr. Alphonsus Sias

## 2011-11-21 ENCOUNTER — Other Ambulatory Visit: Payer: Self-pay | Admitting: Internal Medicine

## 2011-11-21 MED ORDER — LISDEXAMFETAMINE DIMESYLATE 70 MG PO CAPS: 70.0000 mg | ORAL_CAPSULE | ORAL | Status: DC

## 2011-11-21 NOTE — Telephone Encounter (Signed)
Rx on Vyvanse.

## 2011-11-21 NOTE — Telephone Encounter (Signed)
Spoke with patient and advised results   

## 2011-12-31 ENCOUNTER — Other Ambulatory Visit: Payer: Self-pay | Admitting: Internal Medicine

## 2011-12-31 NOTE — Telephone Encounter (Signed)
Patient requesting refill. 

## 2012-01-01 MED ORDER — LISDEXAMFETAMINE DIMESYLATE 70 MG PO CAPS: 70.0000 mg | ORAL_CAPSULE | ORAL | Status: DC

## 2012-01-01 NOTE — Telephone Encounter (Signed)
Spoke with patient and advised results   

## 2012-01-29 ENCOUNTER — Telehealth: Payer: Self-pay | Admitting: *Deleted

## 2012-01-29 NOTE — Telephone Encounter (Signed)
Form on your desk for PA for Vyvanse.

## 2012-01-30 NOTE — Telephone Encounter (Signed)
Please check what other meds he has tried in the past for his attention problems

## 2012-02-06 NOTE — Telephone Encounter (Signed)
Spoke with patient and he has never tried any other medication for ADD

## 2012-02-06 NOTE — Telephone Encounter (Signed)
Form done 

## 2012-02-06 NOTE — Telephone Encounter (Signed)
Form faxed back.

## 2012-02-12 ENCOUNTER — Telehealth: Payer: Self-pay | Admitting: *Deleted

## 2012-02-12 NOTE — Telephone Encounter (Signed)
Received fax from pharmacy stating that PA is required for Vyvanse 70mg  capsules.  Called [employer] at [phone] to request PA paper work, rep will fax form to our office today.

## 2012-02-13 NOTE — Telephone Encounter (Signed)
PA form in your IN box. 

## 2012-02-13 NOTE — Telephone Encounter (Signed)
This was already done, will refax to [employer].

## 2012-02-18 NOTE — Telephone Encounter (Signed)
Received PA approval for Vyvanse 70mg .  Pharmacy has already been notified per approval letter, I will notify patient and seen letter to be scanned.

## 2012-03-26 ENCOUNTER — Other Ambulatory Visit: Payer: Self-pay

## 2012-03-26 NOTE — Telephone Encounter (Signed)
Pt request written rx Vyvanse 70 mg. Pt last seen 09/25/11. When rx ready for pick up call [phone].

## 2012-03-27 MED ORDER — LISDEXAMFETAMINE DIMESYLATE 70 MG PO CAPS: 70.0000 mg | ORAL_CAPSULE | ORAL | Status: DC

## 2012-03-27 NOTE — Telephone Encounter (Signed)
Left message on machine that rx is ready for pick-up, and it will be at our front desk. And that he needs to sch a f/u

## 2012-03-27 NOTE — Telephone Encounter (Signed)
He is due for a 6 month follow up

## 2012-03-31 ENCOUNTER — Encounter: Payer: 59 | Admitting: Internal Medicine

## 2012-05-15 ENCOUNTER — Other Ambulatory Visit: Payer: Self-pay

## 2012-05-15 MED ORDER — LISDEXAMFETAMINE DIMESYLATE 70 MG PO CAPS: 70.0000 mg | ORAL_CAPSULE | ORAL | Status: DC

## 2012-05-15 NOTE — Telephone Encounter (Signed)
Left message on machine that rx is ready for pick-up, and it will be at our front desk. Also left message as stated by Dr. Alphonsus Sias.

## 2012-05-15 NOTE — Telephone Encounter (Signed)
Pt request rx Vyvanse. Call when ready for pick up. 

## 2012-05-15 NOTE — Telephone Encounter (Signed)
Let him know that I really like 6 month follow up and he cancelled last visit and didn't reschedule till November. This is not really the way I am comfortable when prescribing controlled substances. We can keep the November appt but he better not cancel or reschedule again or I won't be able to refill his Rx

## 2012-06-23 ENCOUNTER — Other Ambulatory Visit: Payer: Self-pay

## 2012-06-23 MED ORDER — LISDEXAMFETAMINE DIMESYLATE 70 MG PO CAPS: 70.0000 mg | ORAL_CAPSULE | ORAL | Status: DC

## 2012-06-23 NOTE — Telephone Encounter (Signed)
Pt request Vyvanse rx. Call when ready for pick up. 

## 2012-06-23 NOTE — Telephone Encounter (Signed)
Spoke with patient and advised rx ready for pick-up and it will be at the front desk.  

## 2012-08-13 ENCOUNTER — Other Ambulatory Visit: Payer: Self-pay

## 2012-08-13 MED ORDER — LISDEXAMFETAMINE DIMESYLATE 70 MG PO CAPS: 70.0000 mg | ORAL_CAPSULE | ORAL | Status: DC

## 2012-08-13 NOTE — Telephone Encounter (Signed)
Pt request rx for Vyvanse. Call when ready for pick up. 

## 2012-08-13 NOTE — Telephone Encounter (Signed)
Left message on machine that rx is ready for pick-up, and it will be at our front desk.  

## 2012-09-23 ENCOUNTER — Other Ambulatory Visit: Payer: Self-pay

## 2012-09-23 NOTE — Telephone Encounter (Signed)
Pt left v/m requesting rx Vyvanse. Call when ready for pick up. 

## 2012-09-24 MED ORDER — LISDEXAMFETAMINE DIMESYLATE 70 MG PO CAPS: 70.0000 mg | ORAL_CAPSULE | ORAL | Status: DC

## 2012-09-24 NOTE — Telephone Encounter (Signed)
Spoke with patient and advised rx ready for pick-up and it will be at the front desk.  

## 2012-11-02 ENCOUNTER — Other Ambulatory Visit: Payer: Self-pay

## 2012-11-02 MED ORDER — LISDEXAMFETAMINE DIMESYLATE 70 MG PO CAPS: 70.0000 mg | ORAL_CAPSULE | ORAL | Status: DC

## 2012-11-02 NOTE — Telephone Encounter (Signed)
Left message on machine that rx is ready for pick-up, and it will be at our front desk.  

## 2012-11-02 NOTE — Telephone Encounter (Signed)
Pt left v/m requesting rx for Vyvanse. Pt has CPX 11/09/12. Call when ready for pick up.

## 2012-11-09 ENCOUNTER — Encounter: Payer: Self-pay | Admitting: Internal Medicine

## 2012-11-09 ENCOUNTER — Ambulatory Visit (INDEPENDENT_AMBULATORY_CARE_PROVIDER_SITE_OTHER): Payer: Managed Care, Other (non HMO) | Admitting: Internal Medicine

## 2012-11-09 VITALS — BP 140/100 | HR 94 | Temp 98.6°F | Ht 71.0 in | Wt 249.0 lb

## 2012-11-09 DIAGNOSIS — E785 Hyperlipidemia, unspecified: Secondary | ICD-10-CM

## 2012-11-09 DIAGNOSIS — Z0000 Encounter for general adult medical examination without abnormal findings: Secondary | ICD-10-CM

## 2012-11-09 DIAGNOSIS — F988 Other specified behavioral and emotional disorders with onset usually occurring in childhood and adolescence: Secondary | ICD-10-CM

## 2012-11-09 DIAGNOSIS — R030 Elevated blood-pressure reading, without diagnosis of hypertension: Secondary | ICD-10-CM

## 2012-11-09 LAB — CBC WITH DIFFERENTIAL/PLATELET
Basophils Absolute: 0 10*3/uL (ref 0.0–0.1)
Eosinophils Relative: 3 % (ref 0.0–5.0)
Hemoglobin: 16 g/dL (ref 13.0–17.0)
Lymphocytes Relative: 22.3 % (ref 12.0–46.0)
Monocytes Relative: 9 % (ref 3.0–12.0)
Neutro Abs: 5.2 10*3/uL (ref 1.4–7.7)
RBC: 5.36 Mil/uL (ref 4.22–5.81)
RDW: 13.1 % (ref 11.5–14.6)
WBC: 7.9 10*3/uL (ref 4.5–10.5)

## 2012-11-09 LAB — HEPATIC FUNCTION PANEL
AST: 28 U/L (ref 0–37)
Alkaline Phosphatase: 66 U/L (ref 39–117)
Bilirubin, Direct: 0 mg/dL (ref 0.0–0.3)
Total Protein: 6.9 g/dL (ref 6.0–8.3)

## 2012-11-09 LAB — MICROALBUMIN / CREATININE URINE RATIO
Creatinine,U: 266.8 mg/dL
Microalb Creat Ratio: 1 mg/g (ref 0.0–30.0)
Microalb, Ur: 2.6 mg/dL — ABNORMAL HIGH (ref 0.0–1.9)

## 2012-11-09 LAB — BASIC METABOLIC PANEL
Calcium: 9 mg/dL (ref 8.4–10.5)
GFR: 83.61 mL/min (ref >60.00)
Glucose, Bld: 118 mg/dL — ABNORMAL HIGH (ref 70–99)
Potassium: 3.8 mEq/L (ref 3.5–5.1)
Sodium: 135 mEq/L (ref 135–145)

## 2012-11-09 LAB — LIPID PANEL: Total CHOL/HDL Ratio: 10

## 2012-11-09 NOTE — Assessment & Plan Note (Signed)
Generally healthy but work and sleep schedule are really affecting his health Discussed changing schedule if possible Get back to exercising and reduce weight again Flu vaccine after cold clears

## 2012-11-09 NOTE — Assessment & Plan Note (Signed)
Still gets benefit from the vyvanse

## 2012-11-09 NOTE — Assessment & Plan Note (Signed)
Will check labs Discussed lifestyle measures No meds for now  Recheck on right 138/100

## 2012-11-09 NOTE — Progress Notes (Signed)
Subjective:    Patient ID: John Adkins, male    DOB: [birth year], 41 y.o.   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 Has tried without it --focus is a problem  Now with cold for 2 weeks or so Persistent cough Ongoing dust exposure at work also exacerbates No fever  Current Outpatient Prescriptions on File Prior to Visit  Medication Sig Dispense Refill  . lisdexamfetamine (VYVANSE) 70 MG capsule Take 1 capsule (70 mg total) by mouth every morning.  30 capsule  0    Allergies  Allergen Reactions  . Amphetamine-Dextroamphetamine     REACTION: headache and didn't work well    Past Medical History  Diagnosis Date  . Allergy   . Anxiety   . Hyperlipidemia   . ADHD (attention deficit hyperactivity disorder)   . Diverticulitis   . Abdominal pain   . Wears glasses     wears contacts    Past Surgical History  Procedure Date  . Knee surgery 1992    right   . Ankle surgery 1993    left ankle   . Sigmoid colectomy 9/12    Dr Oneida Arenas recurrent diverticulitis  . Shoulder surgery 10/12    Dr Ave Filter    Family History  Problem Relation Age of Onset  . Diabetes Mother   . Heart disease Mother   . Alzheimer's disease Maternal Grandmother   . Heart disease Maternal Grandfather   . Cancer Father     prostate cancer     History   Social History  . Marital Status: Married    Spouse Name: N/A    Number of Children: 2  . Years of Education: N/A   Occupational History  . Buyer, retail   Social History Main Topics  . Smoking status: Never Smoker   . Smokeless tobacco: Current User    Types: Chew  . Alcohol Use: Yes  . Drug Use: No  . Sexually Active: Not on file   Other Topics Concern  . Not on file   Social History Narrative  . No narrative on  file   Review of Systems  Constitutional: Positive for fatigue. Negative for fever.       Wears seat belt  HENT: Negative for hearing loss, congestion, rhinorrhea, dental problem and tinnitus.        No allergy problems Regular with dentist  Eyes: Negative for visual disturbance.       No diplopia or unilateral vision loss  Respiratory: Positive for cough. Negative for chest tightness and shortness of breath.        Cough just with current cold  Cardiovascular: Negative for chest pain, palpitations and leg swelling.  Gastrointestinal: Negative for nausea, vomiting, abdominal pain, constipation and blood in stool.       No heartburn  Genitourinary: Negative for urgency, frequency and difficulty urinating.       No sexual problems  Musculoskeletal: Negative for joint swelling, arthralgias and gait problem.  Skin: Positive for color change. Negative for rash.       Chronic vitiligo on hands  Neurological: Negative for dizziness, syncope, weakness, light-headedness, numbness and headaches.  Hematological: Negative for adenopathy. Does not bruise/bleed easily.  Psychiatric/Behavioral: Positive for sleep disturbance. Negative for dysphoric mood. The patient is  not nervous/anxious.        Mostly just not enough time to sleep         Objective:   Physical Exam  Constitutional: He is oriented to person, place, and time. He appears well-developed and well-nourished. No distress.  HENT:  Head: Normocephalic and atraumatic.  Right Ear: External ear normal.  Left Ear: External ear normal.  Mouth/Throat: Oropharynx is clear and moist. No oropharyngeal exudate.  Eyes: Conjunctivae normal and EOM are normal. Pupils are equal, round, and reactive to light.  Neck: Normal range of motion. Neck supple. No thyromegaly present.  Cardiovascular: Normal rate, regular rhythm, normal heart sounds and intact distal pulses.  Exam reveals no gallop.   No murmur heard. Pulmonary/Chest: Effort normal and  breath sounds normal. No respiratory distress. He has no wheezes. He has no rales.  Abdominal: Soft. There is no tenderness.  Musculoskeletal: He exhibits no edema and no tenderness.  Lymphadenopathy:    He has no cervical adenopathy.  Neurological: He is alert and oriented to person, place, and time.  Skin: No rash noted. No erythema.  Psychiatric: He has a normal mood and affect. His behavior is normal.          Assessment & Plan:

## 2012-11-13 ENCOUNTER — Encounter: Payer: Self-pay | Admitting: *Deleted

## 2012-12-10 ENCOUNTER — Other Ambulatory Visit: Payer: Self-pay

## 2012-12-10 NOTE — Telephone Encounter (Signed)
Pt left v/m requesting rx Vyvanse. Call when ready for pick up. 

## 2012-12-11 MED ORDER — LISDEXAMFETAMINE DIMESYLATE 70 MG PO CAPS: 70.0000 mg | ORAL_CAPSULE | ORAL | Status: DC

## 2012-12-11 NOTE — Telephone Encounter (Signed)
Spoke with patient and advised rx ready for pick-up and it will be at the front desk.  

## 2013-01-19 ENCOUNTER — Other Ambulatory Visit: Payer: Self-pay

## 2013-01-19 NOTE — Telephone Encounter (Signed)
Pt left v/m requesting rx vyvanse. Call when ready for pick up. 

## 2013-01-20 MED ORDER — LISDEXAMFETAMINE DIMESYLATE 70 MG PO CAPS: 70.0000 mg | ORAL_CAPSULE | ORAL | Status: DC

## 2013-01-20 NOTE — Telephone Encounter (Signed)
Left message on machine that rx is ready for pick-up, and it will be at our front desk.  

## 2013-03-01 ENCOUNTER — Other Ambulatory Visit: Payer: Self-pay

## 2013-03-01 MED ORDER — LISDEXAMFETAMINE DIMESYLATE 70 MG PO CAPS: 70.0000 mg | ORAL_CAPSULE | ORAL | Status: DC

## 2013-03-01 NOTE — Telephone Encounter (Signed)
Pt left v/m requesting rx Vyvanse. Call when ready for pick up. 

## 2013-03-01 NOTE — Telephone Encounter (Signed)
Spoke with patient and advised rx ready for pick-up and it will be at the front desk.  

## 2013-04-12 ENCOUNTER — Other Ambulatory Visit: Payer: Self-pay

## 2013-04-12 NOTE — Telephone Encounter (Signed)
Pt left v/m requesting rx Vyvanse. Call when ready for pick up. 

## 2013-04-13 MED ORDER — LISDEXAMFETAMINE DIMESYLATE 70 MG PO CAPS: 70.0000 mg | ORAL_CAPSULE | ORAL | Status: DC

## 2013-04-13 NOTE — Telephone Encounter (Signed)
Left message on machine that rx is ready for pick-up, and it will be at our front desk.  

## 2013-05-14 ENCOUNTER — Encounter: Payer: Self-pay | Admitting: Radiology

## 2013-05-17 ENCOUNTER — Ambulatory Visit (INDEPENDENT_AMBULATORY_CARE_PROVIDER_SITE_OTHER): Payer: Managed Care, Other (non HMO) | Admitting: Internal Medicine

## 2013-05-17 ENCOUNTER — Encounter: Payer: Self-pay | Admitting: Internal Medicine

## 2013-05-17 VITALS — BP 120/90 | HR 95 | Temp 97.0°F | Wt 251.0 lb

## 2013-05-17 DIAGNOSIS — F988 Other specified behavioral and emotional disorders with onset usually occurring in childhood and adolescence: Secondary | ICD-10-CM

## 2013-05-17 MED ORDER — LISDEXAMFETAMINE DIMESYLATE 70 MG PO CAPS: 70.0000 mg | ORAL_CAPSULE | ORAL | Status: DC

## 2013-05-17 NOTE — Assessment & Plan Note (Signed)
Still benefits from vyvanse Only uses for work Last dose 3 days ago---may affect his urine testing

## 2013-05-17 NOTE — Progress Notes (Signed)
  Subjective:    Patient ID: John Adkins, male    DOB: 1971-09-10, 42 y.o.   MRN: [id number]  HPI Doing okay Still commuting to work--- 4PM -5AM and 4 days per week  Had bad cold that lasted for about a month. Weight went up during this time (and with a cruise) Now back down some  Got hit with baseball in mouth in college Having more work done---eventually will have implants  Uses the vyvanse for work still Last dose 3 days ago Still has 5 tabs left from Rx from just over a month ago  Current Outpatient Prescriptions on File Prior to Visit  Medication Sig Dispense Refill  . lisdexamfetamine (VYVANSE) 70 MG capsule Take 1 capsule (70 mg total) by mouth every morning.  30 capsule  0   No current facility-administered medications on file prior to visit.    Allergies  Allergen Reactions  . Amphetamine-Dextroamphetamine     REACTION: headache and didn't work well    Past Medical History  Diagnosis Date  . Allergy   . Anxiety   . Hyperlipidemia   . ADHD (attention deficit hyperactivity disorder)   . Diverticulitis   . Abdominal pain   . Wears glasses     wears contacts    Past Surgical History  Procedure Laterality Date  . Knee surgery  1992    right   . Ankle surgery  1993    left ankle   . Sigmoid colectomy  9/12    Dr Oneida Arenas recurrent diverticulitis  . Shoulder surgery  10/12    Dr Ave Filter    Family History  Problem Relation Age of Onset  . Diabetes Mother   . Heart disease Mother   . Alzheimer's disease Maternal Grandmother   . Heart disease Maternal Grandfather   . Cancer Father     prostate cancer     History   Social History  . Marital Status: Married    Spouse Name: N/A    Number of Children: 2  . Years of Education: N/A   Occupational History  . Buyer, retail   Social History Main Topics  . Smoking status: Never Smoker   . Smokeless tobacco: Current User    Types: Chew  . Alcohol Use: Yes  . Drug Use: No   . Sexually Active: Not on file   Other Topics Concern  . Not on file   Social History Narrative  . No narrative on file   Review of Systems Only able to work out 3 days per week Sleeps okay most of the time    Objective:   Physical Exam  Constitutional: He appears well-developed and well-nourished. No distress.  Psychiatric: He has a normal mood and affect. His behavior is normal.          Assessment & Plan:

## 2013-05-28 ENCOUNTER — Encounter: Payer: Self-pay | Admitting: Internal Medicine

## 2013-07-01 ENCOUNTER — Other Ambulatory Visit: Payer: Self-pay

## 2013-07-01 NOTE — Telephone Encounter (Signed)
Pt left v/m requesting rx for vyvanse. Call when ready for pick up.

## 2013-07-02 MED ORDER — LISDEXAMFETAMINE DIMESYLATE 70 MG PO CAPS: 70.0000 mg | ORAL_CAPSULE | ORAL | Status: DC

## 2013-07-02 NOTE — Telephone Encounter (Signed)
Spoke with patient and advised rx ready for pick-up and it will be at the front desk.  

## 2013-08-09 ENCOUNTER — Other Ambulatory Visit: Payer: Self-pay | Admitting: Family Medicine

## 2013-08-09 NOTE — Telephone Encounter (Signed)
Last filled 07/01/13

## 2013-08-10 MED ORDER — LISDEXAMFETAMINE DIMESYLATE 70 MG PO CAPS: 70.0000 mg | ORAL_CAPSULE | ORAL | Status: DC

## 2013-08-10 NOTE — Telephone Encounter (Signed)
Spoke with patient and advised rx ready for pick-up and it will be at the front desk.  

## 2013-09-09 ENCOUNTER — Other Ambulatory Visit: Payer: Self-pay

## 2013-09-09 MED ORDER — LISDEXAMFETAMINE DIMESYLATE 70 MG PO CAPS: 70.0000 mg | ORAL_CAPSULE | ORAL | Status: DC

## 2013-09-09 NOTE — Telephone Encounter (Signed)
Pt left v/m requesting rx Vyvanse. Call when ready for pick up. 

## 2013-09-09 NOTE — Telephone Encounter (Signed)
Left message on machine that rx is ready for pick-up, and it will be at our front desk.  

## 2013-10-18 ENCOUNTER — Telehealth: Payer: Self-pay | Admitting: Family Medicine

## 2013-10-18 ENCOUNTER — Other Ambulatory Visit: Payer: Self-pay | Admitting: Family Medicine

## 2013-10-18 MED ORDER — LISDEXAMFETAMINE DIMESYLATE 70 MG PO CAPS: 70.0000 mg | ORAL_CAPSULE | ORAL | Status: DC

## 2013-10-18 NOTE — Telephone Encounter (Signed)
Please have him set up his physical within the next 2-3 months

## 2013-10-18 NOTE — Telephone Encounter (Signed)
Pt left v/m requesting rx Vyvanse. Call when ready for pick up. 

## 2013-10-19 NOTE — Telephone Encounter (Signed)
Spoke with patient and advised rx ready for pick-up and it will be at the front desk.  

## 2013-10-20 NOTE — Telephone Encounter (Signed)
Error

## 2013-11-23 ENCOUNTER — Other Ambulatory Visit: Payer: Self-pay

## 2013-11-23 NOTE — Telephone Encounter (Signed)
Pt left v/m requesting rx vyvanse. Call when ready for pick up. 

## 2013-11-24 MED ORDER — LISDEXAMFETAMINE DIMESYLATE 70 MG PO CAPS: 70.0000 mg | ORAL_CAPSULE | ORAL | Status: DC

## 2013-11-24 NOTE — Telephone Encounter (Signed)
Left message on machine that rx is ready for pick-up, and it will be at our front desk. Also wrote on rx that pt needs to schedule CPX

## 2013-11-24 NOTE — Telephone Encounter (Signed)
He was due for his physical this month Have him schedule one for the near future

## 2013-12-20 ENCOUNTER — Encounter: Payer: Self-pay | Admitting: Internal Medicine

## 2013-12-20 ENCOUNTER — Ambulatory Visit (INDEPENDENT_AMBULATORY_CARE_PROVIDER_SITE_OTHER): Payer: Managed Care, Other (non HMO) | Admitting: Internal Medicine

## 2013-12-20 VITALS — BP 140/104 | HR 99 | Temp 97.8°F | Ht 71.0 in | Wt 255.0 lb

## 2013-12-20 DIAGNOSIS — I10 Essential (primary) hypertension: Secondary | ICD-10-CM | POA: Insufficient documentation

## 2013-12-20 DIAGNOSIS — F988 Other specified behavioral and emotional disorders with onset usually occurring in childhood and adolescence: Secondary | ICD-10-CM

## 2013-12-20 DIAGNOSIS — E785 Hyperlipidemia, unspecified: Secondary | ICD-10-CM

## 2013-12-20 DIAGNOSIS — E8881 Metabolic syndrome: Secondary | ICD-10-CM

## 2013-12-20 DIAGNOSIS — Z0000 Encounter for general adult medical examination without abnormal findings: Secondary | ICD-10-CM

## 2013-12-20 DIAGNOSIS — Z23 Encounter for immunization: Secondary | ICD-10-CM

## 2013-12-20 LAB — BASIC METABOLIC PANEL
BUN: 11 mg/dL (ref 6–23)
CALCIUM: 9 mg/dL (ref 8.4–10.5)
CO2: 24 meq/L (ref 19–32)
Chloride: 105 mEq/L (ref 96–112)
Creatinine, Ser: 1.1 mg/dL (ref 0.4–1.5)
GFR: 77.95 mL/min (ref >60.00)
Glucose, Bld: 115 mg/dL — ABNORMAL HIGH (ref 70–99)
Potassium: 3.9 mEq/L (ref 3.5–5.1)
SODIUM: 136 meq/L (ref 135–145)

## 2013-12-20 LAB — HEPATIC FUNCTION PANEL
ALBUMIN: 4.3 g/dL (ref 3.5–5.2)
ALT: 79 U/L — ABNORMAL HIGH (ref 0–53)
AST: 47 U/L — ABNORMAL HIGH (ref 0–37)
Alkaline Phosphatase: 65 U/L (ref 39–117)
BILIRUBIN DIRECT: 0.1 mg/dL (ref 0.0–0.3)
TOTAL PROTEIN: 7.1 g/dL (ref 6.0–8.3)
Total Bilirubin: 1 mg/dL (ref 0.3–1.2)

## 2013-12-20 LAB — CBC WITH DIFFERENTIAL/PLATELET
BASOS ABS: 0 10*3/uL (ref 0.0–0.1)
Basophils Relative: 0.3 % (ref 0.0–3.0)
Eosinophils Absolute: 0.2 10*3/uL (ref 0.0–0.7)
Eosinophils Relative: 2.7 % (ref 0.0–5.0)
HCT: 48.5 % (ref 39.0–52.0)
HEMOGLOBIN: 16.9 g/dL (ref 13.0–17.0)
LYMPHS ABS: 1.3 10*3/uL (ref 0.7–4.0)
LYMPHS PCT: 17.3 % (ref 12.0–46.0)
MCHC: 34.9 g/dL (ref 30.0–36.0)
MCV: 87.4 fl (ref 78.0–100.0)
Monocytes Absolute: 0.7 10*3/uL (ref 0.1–1.0)
Monocytes Relative: 9.1 % (ref 3.0–12.0)
NEUTROS ABS: 5.5 10*3/uL (ref 1.4–7.7)
Neutrophils Relative %: 70.6 % (ref 43.0–77.0)
Platelets: 197 10*3/uL (ref 150.0–400.0)
RBC: 5.55 Mil/uL (ref 4.22–5.81)
RDW: 12.9 % (ref 11.5–14.6)
WBC: 7.7 10*3/uL (ref 4.5–10.5)

## 2013-12-20 LAB — LIPID PANEL
CHOLESTEROL: 193 mg/dL (ref 0–200)
HDL: 19.9 mg/dL — ABNORMAL LOW (ref >39.00)
Total CHOL/HDL Ratio: 10
Triglycerides: 387 mg/dL — ABNORMAL HIGH (ref 0.0–149.0)
VLDL: 77.4 mg/dL — AB (ref 0.0–40.0)

## 2013-12-20 LAB — HEMOGLOBIN A1C: HEMOGLOBIN A1C: 6.1 % (ref 4.6–6.5)

## 2013-12-20 LAB — T4, FREE: FREE T4: 0.68 ng/dL (ref 0.60–1.60)

## 2013-12-20 LAB — TSH: TSH: 1.92 u[IU]/mL (ref 0.35–5.50)

## 2013-12-20 NOTE — Assessment & Plan Note (Signed)
No meds for this 

## 2013-12-20 NOTE — Assessment & Plan Note (Signed)
Discussed fitness 

## 2013-12-20 NOTE — Progress Notes (Signed)
Subjective:    Patient ID: John Adkins, John Adkins    DOB: [birth year], 43 y.o.   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 lisdexamfetamine (VYVANSE) 70 MG capsule Take 1 capsule (70 mg total) by mouth every morning.  30 capsule  0   No current facility-administered medications on file prior to visit.    Allergies  Allergen Reactions  . Amphetamine-Dextroamphetamine     REACTION: headache and didn't work well    Past Medical History  Diagnosis Date  . Allergy   . Anxiety   . Hyperlipidemia   . ADHD (attention deficit hyperactivity disorder)   . Diverticulitis   . Abdominal pain   . Wears glasses     wears contacts    Past Surgical History  Procedure Laterality Date  . Knee surgery  1992    right   . Ankle surgery  1993    left ankle   . Sigmoid colectomy  9/12    Dr Oneida ArenasMartin---for recurrent diverticulitis  . Shoulder surgery  10/12    Dr Ave Filterhandler    Family History  Problem Relation Age of Onset  . Diabetes Mother   . Heart disease Mother   . Alzheimer's disease Maternal Grandmother   . Heart disease Maternal Grandfather   . Cancer Father     prostate cancer     History   Social History  . Marital Status: Married    Spouse Name: N/A    Number of Children: 2  . Years of Education: N/A   Occupational History  . 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 Smoking status: Never Smoker   . Smokeless tobacco: Current User    Types: Chew  . Alcohol Use: Yes  . Drug Use: No  . Sexual Activity: Not on file    Other Topics Concern  . Not on file   Social History Narrative  . No narrative on file   Review of Systems  Constitutional: Positive for unexpected weight change. Negative for fatigue.       Wears seat belt  HENT: Negative for congestion, dental problem, hearing loss, rhinorrhea and tinnitus.        Regular with dentist  Eyes: Negative for visual disturbance.       No diplopia or unilateral vision loss  Respiratory: Negative for cough, chest tightness and shortness of breath.   Cardiovascular: Negative for chest pain, palpitations and leg swelling.  Gastrointestinal: Negative for nausea, vomiting, abdominal pain, constipation and blood in stool.       No heartburn  Endocrine: Negative for cold intolerance and heat intolerance.  Genitourinary: Negative for urgency, frequency and difficulty urinating.       No sexual problems  Musculoskeletal: Negative for arthralgias, back pain and joint swelling.  Skin: Negative for rash.       No suspicious lesions  Allergic/Immunologic: Negative for environmental allergies and immunocompromised state.  Neurological: Negative for dizziness, syncope, weakness, light-headedness, numbness and headaches.  Hematological: Negative for adenopathy. Does not bruise/bleed  easily.  Psychiatric/Behavioral: Negative for sleep disturbance and dysphoric mood. The patient is not nervous/anxious.        Objective:   Physical Exam  Constitutional: He is oriented to person, place, and time. He appears well-developed and well-nourished. No distress.  HENT:  Head: Normocephalic and atraumatic.  Right Ear: External ear normal.  Left Ear: External ear normal.  Mouth/Throat: No oropharyngeal exudate.  Eyes: Conjunctivae and EOM are normal. Pupils are equal, round, and reactive to light.  Neck: Normal range of motion. Neck supple. No thyromegaly present.  Cardiovascular: Normal rate, regular rhythm, normal heart sounds and intact distal pulses.  Exam reveals no  gallop.   No murmur heard. Pulmonary/Chest: Effort normal and breath sounds normal. No respiratory distress. He has no wheezes. He has no rales.  Abdominal: Soft. There is no tenderness.  Musculoskeletal: He exhibits no edema and no tenderness.  Lymphadenopathy:    He has no cervical adenopathy.  Neurological: He is alert and oriented to person, place, and time.  Skin: No rash noted. No erythema.  Psychiatric: He has a normal mood and affect. His behavior is normal.          Assessment & Plan:

## 2013-12-20 NOTE — Progress Notes (Signed)
Pre-visit discussion using our clinic review tool. No additional management support is needed unless otherwise documented below in the visit note.  

## 2013-12-20 NOTE — Assessment & Plan Note (Signed)
BP Readings from Last 3 Encounters:  12/20/13 140/104  05/17/13 120/90  11/09/12 140/100   Has been variable He is not opposed to taking med but will work on fitness for now If microal positive--will start losartan

## 2013-12-20 NOTE — Assessment & Plan Note (Signed)
Generally healthy Discussed fitness and proper eating

## 2013-12-20 NOTE — Assessment & Plan Note (Signed)
Has done okay with the vyvanse for work  Will continue

## 2013-12-20 NOTE — Patient Instructions (Signed)
DASH Diet  The DASH diet stands for "Dietary Approaches to Stop Hypertension." It is a healthy eating plan that has been shown to reduce high blood pressure (hypertension) in as little as 14 days, while also possibly providing other significant health benefits. These other health benefits include reducing the risk of breast cancer after menopause and reducing the risk of type 2 diabetes, heart disease, colon cancer, and stroke. Health benefits also include weight loss and slowing kidney failure in patients with chronic kidney disease.   DIET GUIDELINES  · Limit salt (sodium). Your diet should contain less than 1500 mg of sodium daily.  · Limit refined or processed carbohydrates. Your diet should include mostly whole grains. Desserts and added sugars should be used sparingly.  · Include small amounts of heart-healthy fats. These types of fats include nuts, oils, and tub margarine. Limit saturated and trans fats. These fats have been shown to be harmful in the body.  CHOOSING FOODS   The following food groups are based on a 2000 calorie diet. See your Registered Dietitian for individual calorie needs.  Grains and Grain Products (6 to 8 servings daily)  · Eat More Often: Whole-wheat bread, brown rice, whole-grain or wheat pasta, quinoa, popcorn without added fat or salt (air popped).  · Eat Less Often: White bread, white pasta, white rice, cornbread.  Vegetables (4 to 5 servings daily)  · Eat More Often: Fresh, frozen, and canned vegetables. Vegetables may be raw, steamed, roasted, or grilled with a minimal amount of fat.  · Eat Less Often/Avoid: Creamed or fried vegetables. Vegetables in a cheese sauce.  Fruit (4 to 5 servings daily)  · Eat More Often: All fresh, canned (in natural juice), or frozen fruits. Dried fruits without added sugar. One hundred percent fruit juice (½ cup [237 mL] daily).  · Eat Less Often: Dried fruits with added sugar. Canned fruit in light or heavy syrup.  Lean Meats, Fish, and Poultry (2  servings or less daily. One serving is 3 to 4 oz [85-114 g]).  · Eat More Often: Ninety percent or leaner ground beef, tenderloin, sirloin. Round cuts of beef, chicken breast, turkey breast. All fish. Grill, bake, or broil your meat. Nothing should be fried.  · Eat Less Often/Avoid: Fatty cuts of meat, turkey, or chicken leg, thigh, or wing. Fried cuts of meat or fish.  Dairy (2 to 3 servings)  · Eat More Often: Low-fat or fat-free milk, low-fat plain or light yogurt, reduced-fat or part-skim cheese.  · Eat Less Often/Avoid: Milk (whole, 2%). Whole milk yogurt. Full-fat cheeses.  Nuts, Seeds, and Legumes (4 to 5 servings per week)  · Eat More Often: All without added salt.  · Eat Less Often/Avoid: Salted nuts and seeds, canned beans with added salt.  Fats and Sweets (limited)  · Eat More Often: Vegetable oils, tub margarines without trans fats, sugar-free gelatin. Mayonnaise and salad dressings.  · Eat Less Often/Avoid: Coconut oils, palm oils, butter, stick margarine, cream, half and half, cookies, candy, pie.  FOR MORE INFORMATION  The Dash Diet Eating Plan: www.dashdiet.org  Document Released: 11/21/2011 Document Revised: 02/24/2012 Document Reviewed: 11/21/2011  ExitCare® Patient Information ©2014 [employer].

## 2013-12-21 LAB — MICROALBUMIN / CREATININE URINE RATIO
Creatinine,U: 461.3 mg/dL
Microalb Creat Ratio: 1.1 mg/g (ref 0.0–30.0)
Microalb, Ur: 5.3 mg/dL — ABNORMAL HIGH (ref 0.0–1.9)

## 2013-12-21 LAB — LDL CHOLESTEROL, DIRECT: Direct LDL: 121.3 mg/dL

## 2013-12-21 NOTE — Addendum Note (Signed)
Addended by: Sueanne MargaritaSMITH, DESHANNON L on: 12/21/2013 10:16 AM   Modules accepted: Orders

## 2014-01-11 ENCOUNTER — Other Ambulatory Visit: Payer: Self-pay | Admitting: Family Medicine

## 2014-01-11 NOTE — Telephone Encounter (Signed)
Pt requested Vyvanse RX.  Last filled 11/23/2013.

## 2014-01-12 MED ORDER — LISDEXAMFETAMINE DIMESYLATE 70 MG PO CAPS: 70.0000 mg | ORAL_CAPSULE | ORAL | Status: DC

## 2014-01-12 NOTE — Telephone Encounter (Signed)
Left message on machine that rx is ready for pick-up, and it will be at our front desk.  

## 2014-04-13 ENCOUNTER — Other Ambulatory Visit: Payer: Self-pay

## 2014-04-13 NOTE — Telephone Encounter (Signed)
Pt left v/m requesting rx vyvanse. Call when ready for pick up. 

## 2014-04-14 MED ORDER — LISDEXAMFETAMINE DIMESYLATE 70 MG PO CAPS: 70.0000 mg | ORAL_CAPSULE | ORAL | Status: DC

## 2014-04-14 NOTE — Telephone Encounter (Signed)
Spoke with patient and advised rx ready for pick-up and it will be at the front desk.  

## 2014-05-18 ENCOUNTER — Other Ambulatory Visit: Payer: Self-pay

## 2014-05-18 NOTE — Telephone Encounter (Signed)
Pt left v/m requesting rx vyvanse. Call when ready for pick up. 

## 2014-05-19 MED ORDER — LISDEXAMFETAMINE DIMESYLATE 70 MG PO CAPS: 70.0000 mg | ORAL_CAPSULE | ORAL | Status: DC

## 2014-05-19 NOTE — Telephone Encounter (Signed)
Left message on machine that rx is ready for pick-up, and it will be at our front desk.  

## 2014-06-20 ENCOUNTER — Encounter: Payer: Self-pay | Admitting: Internal Medicine

## 2014-06-20 ENCOUNTER — Encounter: Payer: Self-pay | Admitting: Radiology

## 2014-06-20 ENCOUNTER — Ambulatory Visit (INDEPENDENT_AMBULATORY_CARE_PROVIDER_SITE_OTHER): Payer: BC Managed Care – PPO | Admitting: Internal Medicine

## 2014-06-20 VITALS — BP 128/88 | HR 87 | Temp 97.7°F | Wt 233.0 lb

## 2014-06-20 DIAGNOSIS — F988 Other specified behavioral and emotional disorders with onset usually occurring in childhood and adolescence: Secondary | ICD-10-CM

## 2014-06-20 MED ORDER — AMPHETAMINE-DEXTROAMPHETAMINE 20 MG PO TABS: 20.0000 mg | ORAL_TABLET | Freq: Every day | ORAL | Status: DC

## 2014-06-20 NOTE — Progress Notes (Signed)
Pre visit review using our clinic review tool, if applicable. No additional management support is needed unless otherwise documented below in the visit note. 

## 2014-06-20 NOTE — Assessment & Plan Note (Signed)
Compounded by sleep deprivation due to evening work hours Will try adderall instead for improved price

## 2014-06-20 NOTE — Progress Notes (Signed)
   Subjective:    Patient ID: John RiggKevin E Adkins, male    DOB: 05/24/1971, 43 y.o.   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 lisdexamfetamine (VYVANSE) 70 MG capsule Take 1 capsule (70 mg total) by mouth every morning.  30 capsule  0   No current facility-administered medications on file prior to visit.    Allergies  Allergen Reactions  . Amphetamine-Dextroamphet Er     REACTION: headache and didn't work well    Past Medical History  Diagnosis Date  . Allergy   . Anxiety   . Hyperlipidemia   . ADHD (attention deficit hyperactivity disorder)   . Diverticulitis   . Abdominal pain   . Wears glasses     wears contacts  . Hypertension     Past Surgical History  Procedure Laterality Date  . Knee surgery  1992    right   . Ankle surgery  1993    left ankle   . Sigmoid colectomy  9/12    Dr Oneida ArenasMartin---for recurrent diverticulitis  . Shoulder surgery  10/12    Dr Ave Filterhandler    Family History  Problem Relation Age of Onset  . Diabetes Mother   . Heart disease Mother   . Alzheimer's disease Maternal Grandmother   . Heart disease Maternal Grandfather   . Cancer Father     prostate cancer     History   Social History  . Marital Status: Married    Spouse Name: N/A    Number of Children: 2  . Years of Education: N/A   Occupational History  . 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 Smoking status: Never Smoker   . Smokeless tobacco: Current User    Types: Chew  . Alcohol Use: Yes  . Drug Use: No  . Sexual Activity: Not on  file   Other Topics Concern  . Not on file   Social History Narrative  . No narrative on file   Review of Systems No headaches No stomach trouble Just can't sleep with any other meds except ambien. Melatonin no help     Objective:   Physical Exam  Constitutional: He appears well-developed and well-nourished. No distress.  Psychiatric: He has a normal mood and affect. His behavior is normal.          Assessment & Plan:

## 2014-07-07 ENCOUNTER — Encounter: Payer: Self-pay | Admitting: Internal Medicine

## 2014-08-11 ENCOUNTER — Other Ambulatory Visit: Payer: Self-pay

## 2014-08-11 MED ORDER — AMPHETAMINE-DEXTROAMPHETAMINE 20 MG PO TABS: 20.0000 mg | ORAL_TABLET | Freq: Every day | ORAL | Status: DC

## 2014-08-11 NOTE — Telephone Encounter (Signed)
Pt left v/m requesting rx for Adderall. Call when ready for pick up.  

## 2014-08-11 NOTE — Telephone Encounter (Signed)
Left message on machine that rx is ready for pick-up, and it will be at our front desk.  

## 2014-09-30 ENCOUNTER — Other Ambulatory Visit: Payer: Self-pay

## 2014-09-30 NOTE — Telephone Encounter (Signed)
Pt left v/m requesting rx for Adderall. Call when ready for pick up.  

## 2014-10-03 MED ORDER — AMPHETAMINE-DEXTROAMPHETAMINE 20 MG PO TABS: 20.0000 mg | ORAL_TABLET | Freq: Every day | ORAL | Status: DC

## 2014-10-03 NOTE — Telephone Encounter (Signed)
Left message on machine that rx is ready for pick-up, and it will be at our front desk.  

## 2014-12-01 ENCOUNTER — Other Ambulatory Visit: Payer: Self-pay

## 2014-12-01 MED ORDER — AMPHETAMINE-DEXTROAMPHETAMINE 20 MG PO TABS: 20.0000 mg | ORAL_TABLET | Freq: Every day | ORAL | Status: DC

## 2014-12-01 NOTE — Telephone Encounter (Signed)
Left message on machine that rx is ready for pick-up, and it will be at our front desk.  

## 2014-12-01 NOTE — Telephone Encounter (Signed)
Pt left v/m requesting rx for Adderall. Call when ready for pick up.  

## 2014-12-23 ENCOUNTER — Ambulatory Visit (INDEPENDENT_AMBULATORY_CARE_PROVIDER_SITE_OTHER): Payer: BLUE CROSS/BLUE SHIELD | Admitting: Internal Medicine

## 2014-12-23 ENCOUNTER — Encounter: Payer: Self-pay | Admitting: Internal Medicine

## 2014-12-23 VITALS — BP 140/80 | HR 78 | Temp 98.3°F | Ht 71.0 in | Wt 247.0 lb

## 2014-12-23 DIAGNOSIS — Z23 Encounter for immunization: Secondary | ICD-10-CM

## 2014-12-23 DIAGNOSIS — F900 Attention-deficit hyperactivity disorder, predominantly inattentive type: Secondary | ICD-10-CM

## 2014-12-23 DIAGNOSIS — E8881 Metabolic syndrome: Secondary | ICD-10-CM

## 2014-12-23 DIAGNOSIS — I10 Essential (primary) hypertension: Secondary | ICD-10-CM

## 2014-12-23 DIAGNOSIS — Z0000 Encounter for general adult medical examination without abnormal findings: Secondary | ICD-10-CM

## 2014-12-23 LAB — CBC WITH DIFFERENTIAL/PLATELET
BASOS ABS: 0 10*3/uL (ref 0.0–0.1)
Basophils Relative: 0.3 % (ref 0.0–3.0)
EOS ABS: 0.2 10*3/uL (ref 0.0–0.7)
Eosinophils Relative: 2.4 % (ref 0.0–5.0)
HCT: 51 % (ref 39.0–52.0)
Hemoglobin: 17.4 g/dL — ABNORMAL HIGH (ref 13.0–17.0)
Lymphocytes Relative: 22.7 % (ref 12.0–46.0)
Lymphs Abs: 1.7 10*3/uL (ref 0.7–4.0)
MCHC: 34.2 g/dL (ref 30.0–36.0)
MCV: 87.5 fl (ref 78.0–100.0)
Monocytes Absolute: 0.6 10*3/uL (ref 0.1–1.0)
Monocytes Relative: 7.7 % (ref 3.0–12.0)
NEUTROS PCT: 66.9 % (ref 43.0–77.0)
Neutro Abs: 5 10*3/uL (ref 1.4–7.7)
PLATELETS: 196 10*3/uL (ref 150.0–400.0)
RBC: 5.82 Mil/uL — ABNORMAL HIGH (ref 4.22–5.81)
RDW: 13.3 % (ref 11.5–15.5)
WBC: 7.5 10*3/uL (ref 4.0–10.5)

## 2014-12-23 LAB — COMPREHENSIVE METABOLIC PANEL
ALT: 49 U/L (ref 0–53)
AST: 28 U/L (ref 0–37)
Albumin: 4.5 g/dL (ref 3.5–5.2)
Alkaline Phosphatase: 59 U/L (ref 39–117)
BILIRUBIN TOTAL: 1.1 mg/dL (ref 0.2–1.2)
BUN: 17 mg/dL (ref 6–23)
CALCIUM: 9.6 mg/dL (ref 8.4–10.5)
CO2: 28 mEq/L (ref 19–32)
CREATININE: 1.2 mg/dL (ref 0.4–1.5)
Chloride: 103 mEq/L (ref 96–112)
GFR: 72.24 mL/min (ref >60.00)
Glucose, Bld: 86 mg/dL (ref 70–99)
Potassium: 4.7 mEq/L (ref 3.5–5.1)
Sodium: 140 mEq/L (ref 135–145)
Total Protein: 7.5 g/dL (ref 6.0–8.3)

## 2014-12-23 LAB — LIPID PANEL
CHOL/HDL RATIO: 9
Cholesterol: 212 mg/dL — ABNORMAL HIGH (ref 0–200)
HDL: 24.7 mg/dL — ABNORMAL LOW (ref >39.00)
NonHDL: 187.3
TRIGLYCERIDES: 228 mg/dL — AB (ref 0.0–149.0)
VLDL: 45.6 mg/dL — ABNORMAL HIGH (ref 0.0–40.0)

## 2014-12-23 LAB — HEMOGLOBIN A1C: Hgb A1c MFr Bld: 5.7 % (ref 4.6–6.5)

## 2014-12-23 LAB — T4, FREE: FREE T4: 0.71 ng/dL (ref 0.60–1.60)

## 2014-12-23 LAB — LDL CHOLESTEROL, DIRECT: Direct LDL: 141.2 mg/dL

## 2014-12-23 NOTE — Progress Notes (Signed)
Subjective:    Patient ID: John Adkins, male    DOB: 1971-10-19, 44 y.o.   MRN: [id number]  HPI Here for physical He still has a crazy schedule-- still 6P-6A on weekends but may work days for some shifts during week Still just can't sleep well  Uses the adderall for work Not as good as vyvanse but acceptable Uses the adderall about 4PM before work--discussed that it may be affecting sleep  Wife time shares and has a variable schedule also  Has not been in gym lately Has started skating again and hopes to start playing hockey again  Current Outpatient Prescriptions on File Prior to Visit  Medication Sig Dispense Refill  . amphetamine-dextroamphetamine (ADDERALL) 20 MG tablet Take 1-2 tablets (20-40 mg total) by mouth daily. 60 tablet 0   No current facility-administered medications on file prior to visit.    Allergies  Allergen Reactions  . Amphetamine-Dextroamphet Er     REACTION: headache and didn't work well    Past Medical History  Diagnosis Date  . Allergy   . Anxiety   . Hyperlipidemia   . ADHD (attention deficit hyperactivity disorder)   . Diverticulitis   . Abdominal pain   . Wears glasses     wears contacts  . Hypertension     Past Surgical History  Procedure Laterality Date  . Knee surgery  1992    right   . Ankle surgery  1993    left ankle   . Sigmoid colectomy  9/12    Dr Oneida ArenasMartin---for recurrent diverticulitis  . Shoulder surgery  10/12    Dr Ave Filterhandler    Family History  Problem Relation Age of Onset  . Diabetes Mother   . Heart disease Mother   . Alzheimer's disease Maternal Grandmother   . Heart disease Maternal Grandfather   . Cancer Father     prostate cancer     History   Social History  . Marital Status: Married    Spouse Name: N/A    Number of Children: 2  . Years of Education: N/A   Occupational History  . 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 Smoking status: Never Smoker   . Smokeless  tobacco: Current User    Types: Chew  . Alcohol Use: Yes  . Drug Use: No  . Sexual Activity: Not on file   Other Topics Concern  . Not on file   Social History Narrative   Review of Systems  Constitutional: Positive for unexpected weight change. Negative for fatigue.       Wears seat belt  HENT: Negative for dental problem, hearing loss and tinnitus.        Regular with dentist  Eyes: Negative for visual disturbance.       No diplopia or unilateral vision loss  Respiratory: Negative for cough, chest tightness and shortness of breath.   Cardiovascular: Negative for chest pain, palpitations and leg swelling.  Gastrointestinal: Negative for nausea, vomiting, constipation, blood in stool and abdominal distention.       Bpwels great since diverticulitis surgery  Endocrine: Negative for polydipsia and polyuria.  Genitourinary: Negative for urgency, frequency and difficulty urinating.       No sexual problems  Musculoskeletal: Negative for back pain, joint swelling and arthralgias.  Skin: Negative for rash.       No suspicious lesions  Allergic/Immunologic: Negative for environmental allergies and immunocompromised state.  Neurological: Negative for dizziness, syncope,  weakness, light-headedness, numbness and headaches.  Hematological: Negative for adenopathy. Does not bruise/bleed easily.  Psychiatric/Behavioral: Positive for sleep disturbance. Negative for dysphoric mood. The patient is not nervous/anxious.        Objective:   Physical Exam  Constitutional: He is oriented to person, place, and time. He appears well-developed and well-nourished. No distress.  HENT:  Head: Normocephalic and atraumatic.  Right Ear: External ear normal.  Left Ear: External ear normal.  Mouth/Throat: Oropharynx is clear and moist. No oropharyngeal exudate.  Eyes: Conjunctivae and EOM are normal. Pupils are equal, round, and reactive to light.  Neck: Normal range of motion. Neck supple. No  thyromegaly present.  Cardiovascular: Normal rate, regular rhythm, normal heart sounds and intact distal pulses.  Exam reveals no gallop.   No murmur heard. Pulmonary/Chest: Effort normal and breath sounds normal. No respiratory distress. He has no wheezes. He has no rales.  Abdominal: Soft. There is no tenderness.  Musculoskeletal: He exhibits no edema or tenderness.  Lymphadenopathy:    He has no cervical adenopathy.  Neurological: He is alert and oriented to person, place, and time.  Skin: No rash noted. No erythema.  Psychiatric: He has a normal mood and affect. His behavior is normal.          Assessment & Plan:

## 2014-12-23 NOTE — Assessment & Plan Note (Signed)
Doing okay with less expensive adderall His schedule is a big part of the problem--discussed this

## 2014-12-23 NOTE — Assessment & Plan Note (Signed)
Really needs to lose weight and improve fitness Discussed proper eating and exercise

## 2014-12-23 NOTE — Addendum Note (Signed)
Addended by: Sueanne MargaritaSMITH, Riyanna Crutchley L on: 12/23/2014 02:23 PM   Modules accepted: Orders

## 2014-12-23 NOTE — Patient Instructions (Signed)
DASH Eating Plan °DASH stands for "Dietary Approaches to Stop Hypertension." The DASH eating plan is a healthy eating plan that has been shown to reduce high blood pressure (hypertension). Additional health benefits may include reducing the risk of type 2 diabetes mellitus, heart disease, and stroke. The DASH eating plan may also help with weight loss. °WHAT DO I NEED TO KNOW ABOUT THE DASH EATING PLAN? °For the DASH eating plan, you will follow these general guidelines: °· Choose foods with a percent daily value for sodium of less than 5% (as listed on the food label). °· Use salt-free seasonings or herbs instead of table salt or sea salt. °· Check with your health care provider or pharmacist before using salt substitutes. °· Eat lower-sodium products, often labeled as "lower sodium" or "no salt added." °· Eat fresh foods. °· Eat more vegetables, fruits, and low-fat dairy products. °· Choose whole grains. Look for the word "whole" as the first word in the ingredient list. °· Choose fish and skinless chicken or turkey more often than red meat. Limit fish, poultry, and meat to 6 oz (170 g) each day. °· Limit sweets, desserts, sugars, and sugary drinks. °· Choose heart-healthy fats. °· Limit cheese to 1 oz (28 g) per day. °· Eat more home-cooked food and less restaurant, buffet, and fast food. °· Limit fried foods. °· Cook foods using methods other than frying. °· Limit canned vegetables. If you do use them, rinse them well to decrease the sodium. °· When eating at a restaurant, ask that your food be prepared with less salt, or no salt if possible. °WHAT FOODS CAN I EAT? °Seek help from a dietitian for individual calorie needs. °Grains °Whole grain or whole wheat bread. Brown rice. Whole grain or whole wheat pasta. Quinoa, bulgur, and whole grain cereals. Low-sodium cereals. Corn or whole wheat flour tortillas. Whole grain cornbread. Whole grain crackers. Low-sodium crackers. °Vegetables °Fresh or frozen vegetables  (raw, steamed, roasted, or grilled). Low-sodium or reduced-sodium tomato and vegetable juices. Low-sodium or reduced-sodium tomato sauce and paste. Low-sodium or reduced-sodium canned vegetables.  °Fruits °All fresh, canned (in natural juice), or frozen fruits. °Meat and Other Protein Products °Ground beef (85% or leaner), grass-fed beef, or beef trimmed of fat. Skinless chicken or turkey. Ground chicken or turkey. Pork trimmed of fat. All fish and seafood. Eggs. Dried beans, peas, or lentils. Unsalted nuts and seeds. Unsalted canned beans. °Dairy °Low-fat dairy products, such as skim or 1% milk, 2% or reduced-fat cheeses, low-fat ricotta or cottage cheese, or plain low-fat yogurt. Low-sodium or reduced-sodium cheeses. °Fats and Oils °Tub margarines without trans fats. Light or reduced-fat mayonnaise and salad dressings (reduced sodium). Avocado. Safflower, olive, or canola oils. Natural peanut or almond butter. °Other °Unsalted popcorn and pretzels. °The items listed above may not be a complete list of recommended foods or beverages. Contact your dietitian for more options. °WHAT FOODS ARE NOT RECOMMENDED? °Grains °White bread. White pasta. White rice. Refined cornbread. Bagels and croissants. Crackers that contain trans fat. °Vegetables °Creamed or fried vegetables. Vegetables in a cheese sauce. Regular canned vegetables. Regular canned tomato sauce and paste. Regular tomato and vegetable juices. °Fruits °Dried fruits. Canned fruit in light or heavy syrup. Fruit juice. °Meat and Other Protein Products °Fatty cuts of meat. Ribs, chicken wings, bacon, sausage, bologna, salami, chitterlings, fatback, hot dogs, bratwurst, and packaged luncheon meats. Salted nuts and seeds. Canned beans with salt. °Dairy °Whole or 2% milk, cream, half-and-half, and cream cheese. Whole-fat or sweetened yogurt. Full-fat   cheeses or blue cheese. Nondairy creamers and whipped toppings. Processed cheese, cheese spreads, or cheese  curds. °Condiments °Onion and garlic salt, seasoned salt, table salt, and sea salt. Canned and packaged gravies. Worcestershire sauce. Tartar sauce. Barbecue sauce. Teriyaki sauce. Soy sauce, including reduced sodium. Steak sauce. Fish sauce. Oyster sauce. Cocktail sauce. Horseradish. Ketchup and mustard. Meat flavorings and tenderizers. Bouillon cubes. Hot sauce. Tabasco sauce. Marinades. Taco seasonings. Relishes. °Fats and Oils °Butter, stick margarine, lard, shortening, ghee, and bacon fat. Coconut, palm kernel, or palm oils. Regular salad dressings. °Other °Pickles and olives. Salted popcorn and pretzels. °The items listed above may not be a complete list of foods and beverages to avoid. Contact your dietitian for more information. °WHERE CAN I FIND MORE INFORMATION? °National Heart, Lung, and [employer] Institute: www.nhlbi.nih.gov/health/health-topics/topics/dash/ °Document Released: 11/21/2011 Document Revised: 04/18/2014 Document Reviewed: 10/06/2013 °ExitCare® Patient Information ©2015 [employer]. This information is not intended to replace advice given to you by your health care provider. Make sure you discuss any questions you have with your health care provider. ° °

## 2014-12-23 NOTE — Assessment & Plan Note (Signed)
BP Readings from Last 3 Encounters:  12/23/14 140/80  06/20/14 128/88  12/20/13 140/104   Okay without meds

## 2014-12-23 NOTE — Progress Notes (Signed)
Pre visit review using our clinic review tool, if applicable. No additional management support is needed unless otherwise documented below in the visit note. 

## 2014-12-23 NOTE — Assessment & Plan Note (Signed)
Healthy but needs to work on fitness 

## 2014-12-26 ENCOUNTER — Telehealth: Payer: Self-pay | Admitting: Internal Medicine

## 2014-12-26 NOTE — Telephone Encounter (Signed)
emmi emailed °

## 2015-01-18 ENCOUNTER — Other Ambulatory Visit: Payer: Self-pay

## 2015-01-18 NOTE — Telephone Encounter (Signed)
Pt left v/m requesting rx for Adderall. Call when ready for pick up. Pt last seen 12/23/2014.  

## 2015-01-19 MED ORDER — AMPHETAMINE-DEXTROAMPHETAMINE 20 MG PO TABS: 20.0000 mg | ORAL_TABLET | Freq: Every day | ORAL | Status: DC

## 2015-01-20 NOTE — Telephone Encounter (Signed)
Called patient and notified Rx is ready to be pick up.

## 2015-03-04 ENCOUNTER — Other Ambulatory Visit: Payer: Self-pay | Admitting: Internal Medicine

## 2015-03-06 MED ORDER — AMPHETAMINE-DEXTROAMPHETAMINE 20 MG PO TABS: 20.0000 mg | ORAL_TABLET | Freq: Every day | ORAL | Status: DC

## 2015-03-06 NOTE — Telephone Encounter (Signed)
2-4/16 °

## 2015-03-06 NOTE — Telephone Encounter (Signed)
Message sent to patient that script is up front ready for pickup.

## 2015-03-09 ENCOUNTER — Encounter: Payer: Self-pay | Admitting: Internal Medicine

## 2015-03-09 MED ORDER — CYCLOBENZAPRINE HCL 10 MG PO TABS: 10.0000 mg | ORAL_TABLET | Freq: Three times a day (TID) | ORAL | Status: DC | PRN

## 2015-03-09 NOTE — Telephone Encounter (Signed)
Dr. Dayton MartesAron is this ok? He's had this in the past

## 2015-04-17 ENCOUNTER — Other Ambulatory Visit: Payer: Self-pay | Admitting: Internal Medicine

## 2015-04-18 MED ORDER — AMPHETAMINE-DEXTROAMPHETAMINE 20 MG PO TABS: 20.0000 mg | ORAL_TABLET | Freq: Every day | ORAL | Status: DC

## 2015-04-18 NOTE — Telephone Encounter (Signed)
03/06/2015 

## 2015-05-23 ENCOUNTER — Other Ambulatory Visit: Payer: Self-pay | Admitting: Internal Medicine

## 2015-05-23 NOTE — Telephone Encounter (Signed)
04/18/15

## 2015-05-24 MED ORDER — AMPHETAMINE-DEXTROAMPHETAMINE 20 MG PO TABS: 20.0000 mg | ORAL_TABLET | Freq: Every day | ORAL | Status: DC

## 2015-07-10 ENCOUNTER — Other Ambulatory Visit: Payer: Self-pay | Admitting: Internal Medicine

## 2015-07-11 MED ORDER — AMPHETAMINE-DEXTROAMPHETAMINE 20 MG PO TABS: 20.0000 mg | ORAL_TABLET | Freq: Every day | ORAL | Status: DC

## 2015-07-11 NOTE — Addendum Note (Signed)
Addended by: Sueanne Margarita on: 07/11/2015 02:37 PM   Modules accepted: Orders

## 2015-07-11 NOTE — Telephone Encounter (Signed)
rx was set to phone in not print, rx printed

## 2015-07-11 NOTE — Telephone Encounter (Signed)
05/24/15 LETVAK PATIENT, Please send back to me for call in

## 2015-07-11 NOTE — Telephone Encounter (Signed)
Sent patient message back thru my-chart, that prescription is ready for pick-up and will be at the front desk.  

## 2015-07-11 NOTE — Addendum Note (Signed)
Addended by: Sueanne Margarita on: 07/11/2015 02:39 PM   Modules accepted: Orders

## 2015-07-31 ENCOUNTER — Ambulatory Visit (INDEPENDENT_AMBULATORY_CARE_PROVIDER_SITE_OTHER): Payer: BLUE CROSS/BLUE SHIELD | Admitting: Internal Medicine

## 2015-07-31 ENCOUNTER — Ambulatory Visit (INDEPENDENT_AMBULATORY_CARE_PROVIDER_SITE_OTHER)
Admission: RE | Admit: 2015-07-31 | Discharge: 2015-07-31 | Disposition: A | Discharge status: Home / Self Care | Payer: BLUE CROSS/BLUE SHIELD | Source: Ambulatory Visit | Attending: Internal Medicine | Admitting: Internal Medicine

## 2015-07-31 ENCOUNTER — Encounter: Payer: Self-pay | Admitting: Internal Medicine

## 2015-07-31 VITALS — BP 160/100 | HR 81 | Temp 98.7°F | Wt 237.8 lb

## 2015-07-31 DIAGNOSIS — M25561 Pain in right knee: Secondary | ICD-10-CM

## 2015-07-31 MED ORDER — TRAMADOL HCL 50 MG PO TABS
50.0000 mg | ORAL_TABLET | Freq: Three times a day (TID) | ORAL | Status: DC | PRN
Start: 2015-07-31 — End: 2015-11-21

## 2015-07-31 NOTE — Progress Notes (Signed)
Subjective:    Patient ID: John Adkins, male    DOB: July 15, 1971, 44 y.o.   MRN: [id number]  HPI  Pt presents to the clinic today with c/o right knee pain. He reports this started 6 months ago after he hyperextended it. He reports the knee never really got better but he feels like it got worse 2 weeks ago after he hyperextended it again. He describes the pain as sore and achy. He has difficulty straightening his leg and the pain is worse with weight bearing. He may have some slight numbness and tingling, but he is not positive. He did buy a knee brace and tried stretching. He has also tried Ibuprofen without much relief. He did have surgery on this knee as a teenager.   Review of Systems      Past Medical History  Diagnosis Date  . Allergy   . Anxiety   . Hyperlipidemia   . ADHD (attention deficit hyperactivity disorder)   . Diverticulitis   . Abdominal pain   . Wears glasses     wears contacts  . Hypertension     Current Outpatient Prescriptions  Medication Sig Dispense Refill  . amphetamine-dextroamphetamine (ADDERALL) 20 MG tablet Take 1-2 tablets (20-40 mg total) by mouth daily. 60 tablet 0  . cyclobenzaprine (FLEXERIL) 10 MG tablet Take 1 tablet (10 mg total) by mouth 3 (three) times daily as needed for muscle spasms. 30 tablet 0   No current facility-administered medications for this visit.    Allergies  Allergen Reactions  . Amphetamine-Dextroamphet Er     REACTION: headache and didn't work well    Family History  Problem Relation Age of Onset  . Diabetes Mother   . Heart disease Mother   . Alzheimer's disease Maternal Grandmother   . Heart disease Maternal Grandfather   . Cancer Father     prostate cancer     Social History   Social History  . Marital Status: Married    Spouse Name: N/A  . Number of Children: 2  . Years of Education: N/A   Occupational History  . Buyer, retail   Social History Main Topics  . Smoking status:  Never Smoker   . Smokeless tobacco: Current User    Types: Chew  . Alcohol Use: Yes  . Drug Use: No  . Sexual Activity: Not on file   Other Topics Concern  . Not on file   Social History Narrative     Constitutional: Denies fever, malaise, fatigue, headache or abrupt weight changes.  Respiratory: Denies difficulty breathing, shortness of breath, cough or sputum production.   Cardiovascular: Denies chest pain, chest tightness, palpitations or swelling in the hands or feet.  Musculoskeletal: Pt reports right knee pain. Denies decrease in range of motion, difficulty with gait, muscle pain or joint swelling.  Skin: Denies redness, rashes, lesions or ulcercations.  Neurological: Denies dizziness, difficulty with memory, difficulty with speech or problems with balance and coordination.   No other specific complaints in a complete review of systems (except as listed in HPI above).  Objective:   Physical Exam   BP 160/100 mmHg  Pulse 81  Temp(Src) 98.7 F (37.1 C) (Oral)  Wt 237 lb 12 oz (107.843 kg)  SpO2 98% Wt Readings from Last 3 Encounters:  07/31/15 237 lb 12 oz (107.843 kg)  12/23/14 247 lb (112.038 kg)  06/20/14 233 lb (105.688 kg)    General: Appears his stated age, well developed,  well nourished in NAD. Cardiovascular: Normal rate and rhythm. S1,S2 noted.  No murmur, rubs or gallops noted. Pedal pulse 2+ on the right. Pulmonary/Chest: Normal effort and positive vesicular breath sounds. No respiratory distress. No wheezes, rales or ronchi noted.  Musculoskeletal: Decreased flexion and extension of the right knee. Crepitus noted with ROM. Tender to palpation on the lateral knee. Tender to palpation of the lateral pes bursa. Negative drawer test. No joint swelling noted. He has normal ROM of the right hip and right ankle. He is walking with a limp. Neurological: Alert and oriented. Sensation intact to BLE.     BMET    Component Value Date/Time   NA 140 12/23/2014  1042   K 4.7 12/23/2014 1042   CL 103 12/23/2014 1042   CO2 28 12/23/2014 1042   GLUCOSE 86 12/23/2014 1042   BUN 17 12/23/2014 1042   CREATININE 1.2 12/23/2014 1042   CALCIUM 9.6 12/23/2014 1042   GFRNONAA >60 09/02/2011 1100   GFRAA >60 09/02/2011 1100    Lipid Panel     Component Value Date/Time   CHOL 212* 12/23/2014 1042   TRIG 228.0* 12/23/2014 1042   HDL 24.70* 12/23/2014 1042   CHOLHDL 9 12/23/2014 1042   VLDL 45.6* 12/23/2014 1042    CBC    Component Value Date/Time   WBC 7.5 12/23/2014 1042   RBC 5.82* 12/23/2014 1042   HGB 17.4* 12/23/2014 1042   HCT 51.0 12/23/2014 1042   PLT 196.0 12/23/2014 1042   MCV 87.5 12/23/2014 1042   MCH 29.6 09/09/2011 0431   MCHC 34.2 12/23/2014 1042   RDW 13.3 12/23/2014 1042   LYMPHSABS 1.7 12/23/2014 1042   MONOABS 0.6 12/23/2014 1042   EOSABS 0.2 12/23/2014 1042   BASOSABS 0.0 12/23/2014 1042    Hgb A1C Lab Results  Component Value Date   HGBA1C 5.7 12/23/2014       Assessment & Plan:   Right Knee Pain:  Will obtain xray today Continue Advil for mild pain and swelling RX for Tramadol for moderate to severe pain and swelling May benefit from referral to an orthopedist  RTC as needed or if symptoms persist or worsen

## 2015-07-31 NOTE — Progress Notes (Signed)
Pre visit review using our clinic review tool, if applicable. No additional management support is needed unless otherwise documented below in the visit note. 

## 2015-07-31 NOTE — Patient Instructions (Signed)
Knee Exercises EXERCISES RANGE OF MOTION (ROM) AND STRETCHING EXERCISES These exercises may help you when beginning to rehabilitate your injury. Your symptoms may resolve with or without further involvement from your physician, physical therapist, or athletic trainer. While completing these exercises, remember:   Restoring tissue flexibility helps normal motion to return to the joints. This allows healthier, less painful movement and activity.  An effective stretch should be held for at least 30 seconds.  A stretch should never be painful. You should only feel a gentle lengthening or release in the stretched tissue. STRETCH - Knee Extension, Prone  Lie on your stomach on a firm surface, such as a bed or countertop. Place your right / left knee and leg just beyond the edge of the surface. You may wish to place a towel under the far end of your right / left thigh for comfort.  Relax your leg muscles and allow gravity to straighten your knee. Your clinician may advise you to add an ankle weight if more resistance is helpful for you.  You should feel a stretch in the back of your right / left knee. Hold this position for __________ seconds. Repeat __________ times. Complete this stretch __________ times per day. * Your physician, physical therapist, or athletic trainer may ask you to add ankle weight to enhance your stretch.  RANGE OF MOTION - Knee Flexion, Active  Lie on your back with both knees straight. (If this causes back discomfort, bend your opposite knee, placing your foot flat on the floor.)  Slowly slide your heel back toward your buttocks until you feel a gentle stretch in the front of your knee or thigh.  Hold for __________ seconds. Slowly slide your heel back to the starting position. Repeat __________ times. Complete this exercise __________ times per day.  STRETCH - Quadriceps, Prone   Lie on your stomach on a firm surface, such as a bed or padded floor.  Bend your right /  left knee and grasp your ankle. If you are unable to reach your ankle or pant leg, use a belt around your foot to lengthen your reach.  Gently pull your heel toward your buttocks. Your knee should not slide out to the side. You should feel a stretch in the front of your thigh and/or knee.  Hold this position for __________ seconds. Repeat __________ times. Complete this stretch __________ times per day.  STRETCH - Hamstrings, Supine   Lie on your back. Loop a belt or towel over the ball of your right / left foot.  Straighten your right / left knee and slowly pull on the belt to raise your leg. Do not allow the right / left knee to bend. Keep your opposite leg flat on the floor.  Raise the leg until you feel a gentle stretch behind your right / left knee or thigh. Hold this position for __________ seconds. Repeat __________ times. Complete this stretch __________ times per day.  STRENGTHENING EXERCISES These exercises may help you when beginning to rehabilitate your injury. They may resolve your symptoms with or without further involvement from your physician, physical therapist, or athletic trainer. While completing these exercises, remember:   Muscles can gain both the endurance and the strength needed for everyday activities through controlled exercises.  Complete these exercises as instructed by your physician, physical therapist, or athletic trainer. Progress the resistance and repetitions only as guided.  You may experience muscle soreness or fatigue, but the pain or discomfort you are trying to eliminate   should never worsen during these exercises. If this pain does worsen, stop and make certain you are following the directions exactly. If the pain is still present after adjustments, discontinue the exercise until you can discuss the trouble with your clinician. STRENGTH - Quadriceps, Isometrics  Lie on your back with your right / left leg extended and your opposite knee  bent.  Gradually tense the muscles in the front of your right / left thigh. You should see either your knee cap slide up toward your hip or increased dimpling just above the knee. This motion will push the back of the knee down toward the floor/mat/bed on which you are lying.  Hold the muscle as tight as you can without increasing your pain for __________ seconds.  Relax the muscles slowly and completely in between each repetition. Repeat __________ times. Complete this exercise __________ times per day.  STRENGTH - Quadriceps, Short Arcs   Lie on your back. Place a __________ inch towel roll under your knee so that the knee slightly bends.  Raise only your lower leg by tightening the muscles in the front of your thigh. Do not allow your thigh to rise.  Hold this position for __________ seconds. Repeat __________ times. Complete this exercise __________ times per day.  OPTIONAL ANKLE WEIGHTS: Begin with ____________________, but DO NOT exceed ____________________. Increase in 1 pound/0.5 kilogram increments.  STRENGTH - Quadriceps, Straight Leg Raises  Quality counts! Watch for signs that the quadriceps muscle is working to insure you are strengthening the correct muscles and not "cheating" by substituting with healthier muscles.  Lay on your back with your right / left leg extended and your opposite knee bent.  Tense the muscles in the front of your right / left thigh. You should see either your knee cap slide up or increased dimpling just above the knee. Your thigh may even quiver.  Tighten these muscles even more and raise your leg 4 to 6 inches off the floor. Hold for __________ seconds.  Keeping these muscles tense, lower your leg.  Relax the muscles slowly and completely in between each repetition. Repeat __________ times. Complete this exercise __________ times per day.  STRENGTH - Hamstring, Curls  Lay on your stomach with your legs extended. (If you lay on a bed, your feet  may hang over the edge.)  Tighten the muscles in the back of your thigh to bend your right / left knee up to 90 degrees. Keep your hips flat on the bed/floor.  Hold this position for __________ seconds.  Slowly lower your leg back to the starting position. Repeat __________ times. Complete this exercise __________ times per day.  OPTIONAL ANKLE WEIGHTS: Begin with ____________________, but DO NOT exceed ____________________. Increase in 1 pound/0.5 kilogram increments.  STRENGTH - Quadriceps, Squats  Stand in a door frame so that your feet and knees are in line with the frame.  Use your hands for balance, not support, on the frame.  Slowly lower your weight, bending at the hips and knees. Keep your lower legs upright so that they are parallel with the door frame. Squat only within the range that does not increase your knee pain. Never let your hips drop below your knees.  Slowly return upright, pushing with your legs, not pulling with your hands. Repeat __________ times. Complete this exercise __________ times per day.  STRENGTH - Quadriceps, Wall Slides  Follow guidelines for form closely. Increased knee pain often results from poorly placed feet or knees.  Lean   against a smooth wall or door and walk your feet out 18-24 inches. Place your feet hip-width apart.  Slowly slide down the wall or door until your knees bend __________ degrees.* Keep your knees over your heels, not your toes, and in line with your hips, not falling to either side.  Hold for __________ seconds. Stand up to rest for __________ seconds in between each repetition. Repeat __________ times. Complete this exercise __________ times per day. * Your physician, physical therapist, or athletic trainer will alter this angle based on your symptoms and progress. Document Released: 10/16/2005 Document Revised: 04/18/2014 Document Reviewed: 03/16/2009 Exit[employer] Patient Information 2015 [employer]. This information is not  intended to replace advice given to you by your health care provider. Make sure you discuss any questions you have with your health care provider.  

## 2015-08-16 ENCOUNTER — Other Ambulatory Visit: Payer: Self-pay | Admitting: Family Medicine

## 2015-08-16 NOTE — Telephone Encounter (Signed)
Last office visit 07/31/2015 with Nicki Reaper.  Last refilled 07/11/2015 for #60 with no refills.  Ok to refill?

## 2015-08-17 MED ORDER — AMPHETAMINE-DEXTROAMPHETAMINE 20 MG PO TABS: 20.0000 mg | ORAL_TABLET | Freq: Every day | ORAL | Status: DC

## 2015-08-17 NOTE — Telephone Encounter (Signed)
Sent patient message back thru my-chart, that prescription is ready for pick-up and will be at the front desk.  

## 2015-09-28 ENCOUNTER — Other Ambulatory Visit: Payer: Self-pay | Admitting: Internal Medicine

## 2015-09-28 MED ORDER — AMPHETAMINE-DEXTROAMPHETAMINE 20 MG PO TABS: 20.0000 mg | ORAL_TABLET | Freq: Every day | ORAL | Status: DC

## 2015-09-28 NOTE — Telephone Encounter (Signed)
08/17/15 

## 2015-11-02 ENCOUNTER — Other Ambulatory Visit: Payer: Self-pay | Admitting: Internal Medicine

## 2015-11-02 NOTE — Telephone Encounter (Signed)
Last filled 09/28/2015--please advise 

## 2015-11-03 MED ORDER — AMPHETAMINE-DEXTROAMPHETAMINE 20 MG PO TABS: 20.0000 mg | ORAL_TABLET | Freq: Every day | ORAL | Status: DC

## 2015-11-03 NOTE — Telephone Encounter (Signed)
Sent patient message back thru my-chart, that prescription is ready for pick-up and will be at the front desk.  

## 2015-11-06 ENCOUNTER — Encounter: Payer: Self-pay | Admitting: Family Medicine

## 2015-11-06 ENCOUNTER — Ambulatory Visit (INDEPENDENT_AMBULATORY_CARE_PROVIDER_SITE_OTHER): Payer: BLUE CROSS/BLUE SHIELD | Admitting: Family Medicine

## 2015-11-06 VITALS — BP 160/110 | HR 80 | Temp 98.6°F | Wt 247.0 lb

## 2015-11-06 DIAGNOSIS — I10 Essential (primary) hypertension: Secondary | ICD-10-CM | POA: Diagnosis not present

## 2015-11-06 MED ORDER — HYDROCHLOROTHIAZIDE 12.5 MG PO TABS: 12.5000 mg | ORAL_TABLET | Freq: Every day | ORAL | Status: DC

## 2015-11-06 NOTE — Assessment & Plan Note (Signed)
Start HCTZ, Cr okay prev.  D/w pt about weight and salt in diet.  Update PCP in about 1 week. Okay for outpatient f/u.  He agrees with plan.

## 2015-11-06 NOTE — Progress Notes (Signed)
Pre visit review using our clinic review tool, if applicable. No additional management support is needed unless otherwise documented below in the visit note.  Hypertension:    Never on meds for HTN prev.  BP was up at last OV.   Still up today, d/w pt.   His work schedule is still atypical.   Had been checking BP at home and was up on mult checks.   Had a HA recently.   Chest pain with exertion: no Edema: no Short of breath: no Weight is still up (since knee injury).  Is cutting some salt out of diet.   Has BP cuff at home to check BP.    Meds, vitals, and allergies reviewed.   ROS: See HPI.  Otherwise negative.    GEN: nad, alert and oriented NECK: supple w/o LA CV: rrr. PULM: ctab, no inc wob ABD: soft, +bs EXT: no edema

## 2015-11-06 NOTE — Patient Instructions (Signed)
Start HCTZ once a day.  Check your BP and update Dr. Alphonsus SiasLetvak next week.   Look at the DASH diet.  Take care.  Glad to see you.

## 2015-11-21 ENCOUNTER — Ambulatory Visit (INDEPENDENT_AMBULATORY_CARE_PROVIDER_SITE_OTHER): Payer: BLUE CROSS/BLUE SHIELD | Admitting: Internal Medicine

## 2015-11-21 ENCOUNTER — Encounter: Payer: Self-pay | Admitting: Internal Medicine

## 2015-11-21 VITALS — BP 130/90 | HR 78 | Temp 98.2°F | Wt 243.0 lb

## 2015-11-21 DIAGNOSIS — I10 Essential (primary) hypertension: Secondary | ICD-10-CM

## 2015-11-21 MED ORDER — LOSARTAN POTASSIUM-HCTZ 100-25 MG PO TABS: 1.0000 | ORAL_TABLET | Freq: Every day | ORAL | Status: DC

## 2015-11-21 NOTE — Progress Notes (Signed)
   Subjective:    Patient ID: John Adkins, male    DOB: 12/12/1971, 44 y.o.   MRN: [id number]  HPI Here for follow up of HTN  Was very high at home 170/110 Didn't feel well Doubled the med to 25mg   Usually still up like 150/110 at home Wife does it--pharmacist  No chest pain No SOB Slight dizziness and headache before the medicine --down now  Current Outpatient Prescriptions on File Prior to Visit  Medication Sig Dispense Refill  . amphetamine-dextroamphetamine (ADDERALL) 20 MG tablet Take 1-2 tablets (20-40 mg total) by mouth daily. 60 tablet 0  . hydrochlorothiazide (HYDRODIURIL) 12.5 MG tablet Take 1 tablet (12.5 mg total) by mouth daily. 90 tablet 0   No current facility-administered medications on file prior to visit.    Allergies  Allergen Reactions  . Amphetamine-Dextroamphet Er     REACTION: headache and didn't work well    Past Medical History  Diagnosis Date  . Allergy   . Anxiety   . Hyperlipidemia   . ADHD (attention deficit hyperactivity disorder)   . Diverticulitis   . Abdominal pain   . Wears glasses     wears contacts  . Hypertension     Past Surgical History  Procedure Laterality Date  . Knee surgery  1992    right   . Ankle surgery  1993    left ankle   . Sigmoid colectomy  9/12    Dr Oneida ArenasMartin---for recurrent diverticulitis  . Shoulder surgery  10/12    Dr Ave Filterhandler    Family History  Problem Relation Age of Onset  . Diabetes Mother   . Heart disease Mother   . Alzheimer's disease Maternal Grandmother   . Heart disease Maternal Grandfather   . Cancer Father     prostate cancer     Social History   Social History  . Marital Status: Married    Spouse Name: N/A  . Number of Children: 2  . Years of Education: N/A   Occupational History  . 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 Smoking status: Never Smoker   . Smokeless tobacco: Current User    Types: Chew  . Alcohol Use: 0.0 oz/week    0  Standard drinks or equivalent per week     Comment: occasional  . Drug Use: No  . Sexual Activity: Not on file   Other Topics Concern  . Not on file   Social History Narrative   Review of Systems Not sleeping well 4PM-4AM Friday, Saturday and Sunday. Really messes up his schedule    Objective:   Physical Exam  Constitutional: He appears well-developed and well-nourished. No distress.  Neck: Normal range of motion. Neck supple. No thyromegaly present.  Cardiovascular: Normal rate, regular rhythm and normal heart sounds.  Exam reveals no gallop.   No murmur heard. Pulmonary/Chest: Effort normal and breath sounds normal. No respiratory distress. He has no wheezes. He has no rales.  Musculoskeletal: He exhibits no edema.  Lymphadenopathy:    He has no cervical adenopathy.          Assessment & Plan:

## 2015-11-21 NOTE — Patient Instructions (Signed)
Cut the first losartan/HCTZ in half and take 1/2 daily for the first 2 days. If no problems, then increase to a full pill daily.

## 2015-11-21 NOTE — Progress Notes (Signed)
Pre visit review using our clinic review tool, if applicable. No additional management support is needed unless otherwise documented below in the visit note. 

## 2015-11-21 NOTE — Assessment & Plan Note (Signed)
BP Readings from Last 3 Encounters:  11/21/15 130/90  11/06/15 160/110  07/31/15 160/100   Repeat 146/108 on right Will add losartan

## 2015-11-22 ENCOUNTER — Encounter: Payer: Self-pay | Admitting: Internal Medicine

## 2015-11-22 MED ORDER — CYCLOBENZAPRINE HCL 10 MG PO TABS: 10.0000 mg | ORAL_TABLET | Freq: Three times a day (TID) | ORAL | Status: DC | PRN

## 2015-12-27 ENCOUNTER — Encounter: Payer: BLUE CROSS/BLUE SHIELD | Admitting: Internal Medicine

## 2016-01-03 IMAGING — CR DG KNEE COMPLETE 4+V*R*
5 series · 5 of 5 positions shown · non-contrast
Comparison: None in PACs

CLINICAL DATA: Right knee pain for the past 6 months, no report of
injury

EXAM:
RIGHT KNEE - COMPLETE 4+ VIEW

[view not recorded (1 of 5)]
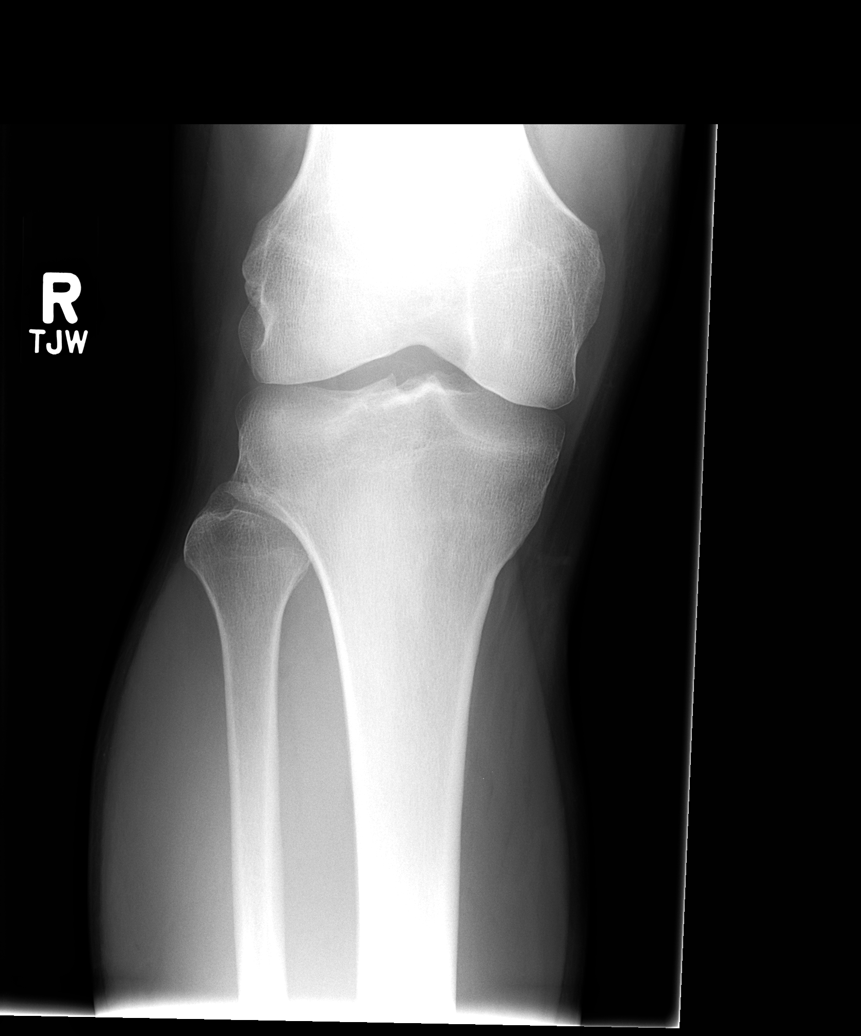

[view not recorded (2 of 5)]
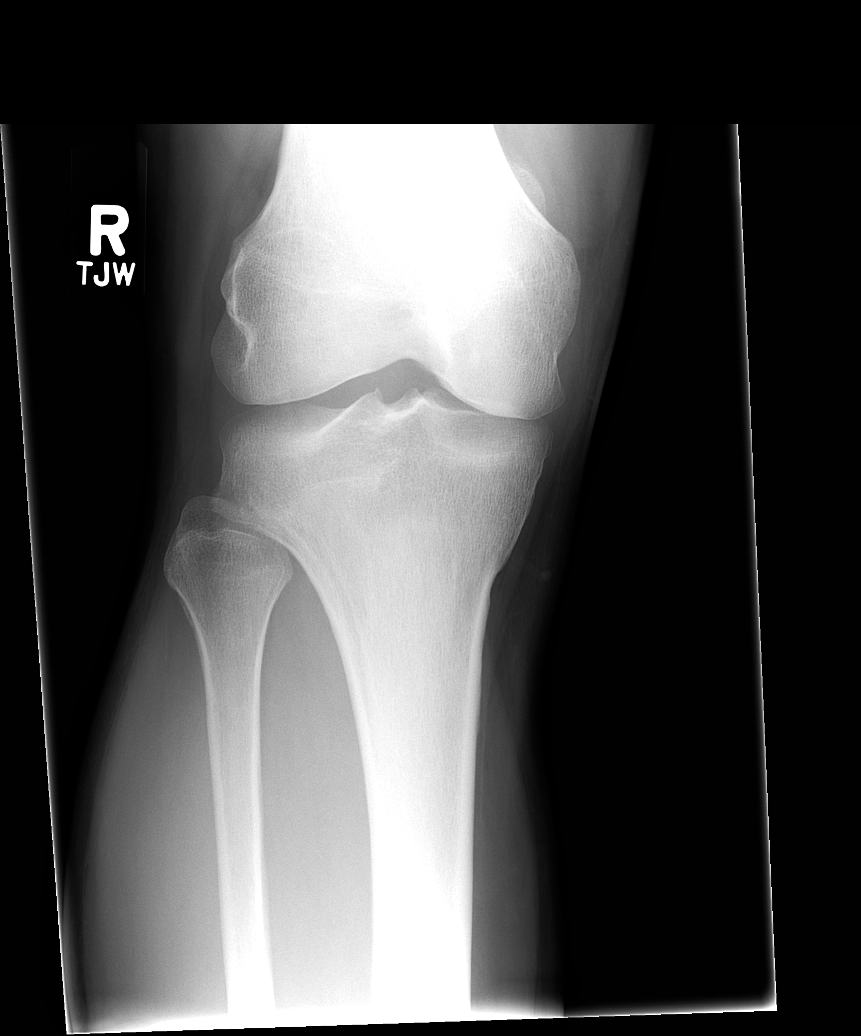

[view not recorded (3 of 5)]
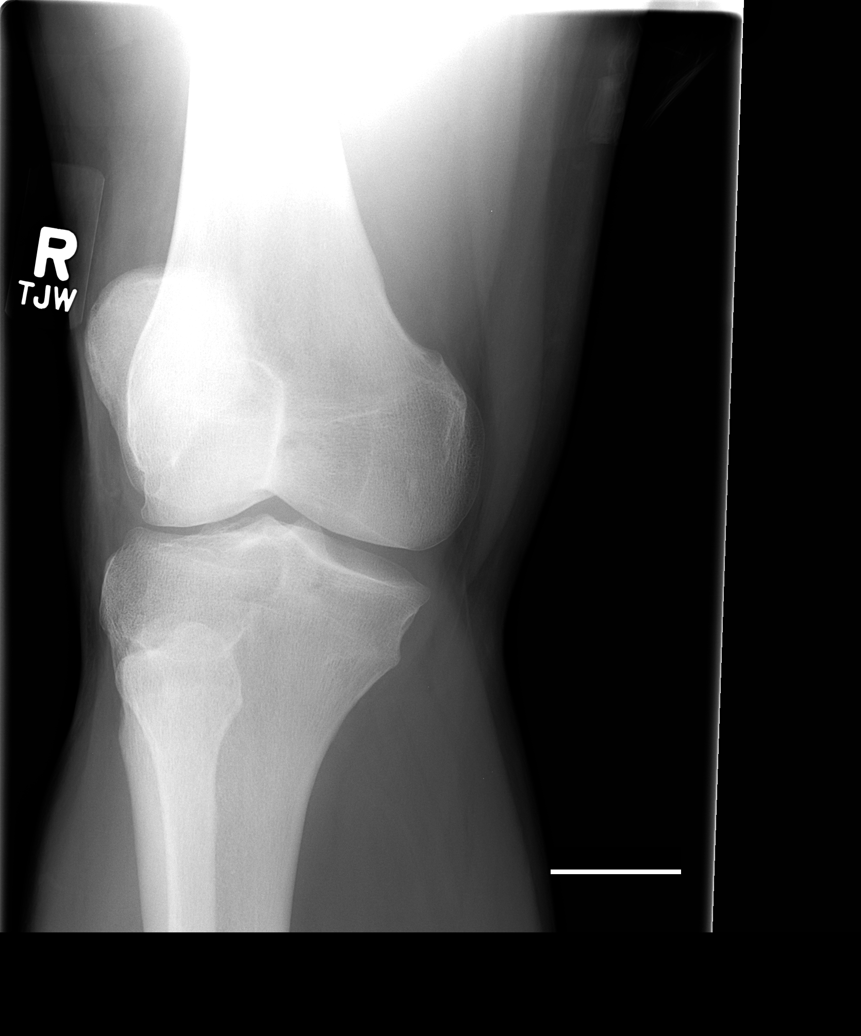

[view not recorded (4 of 5)]
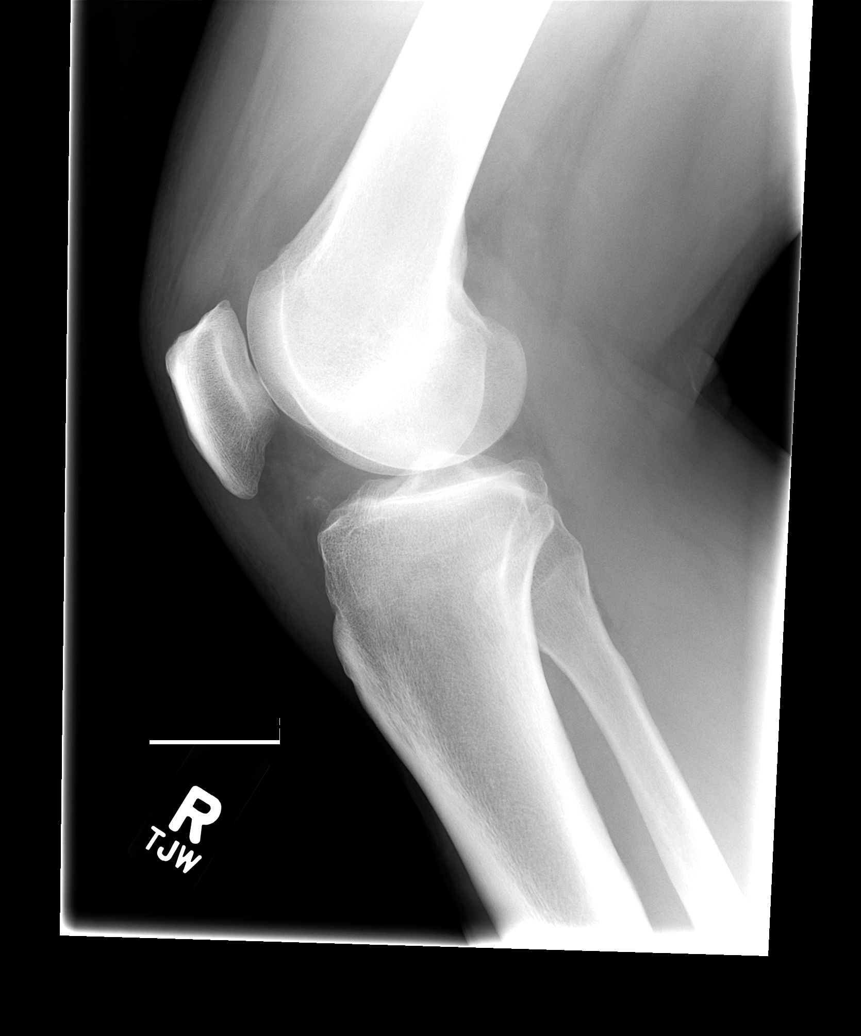

[view not recorded (5 of 5)]
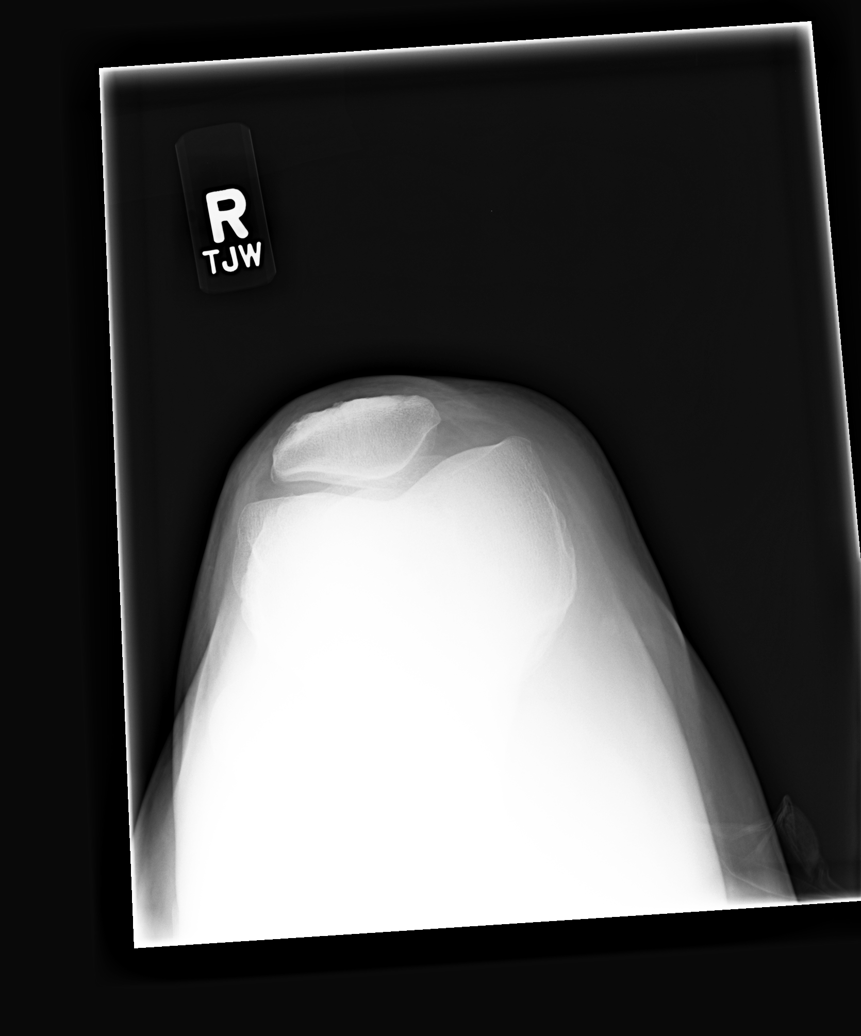

[5 of 5 positions shown; findings below may reference images not displayed]

FINDINGS: The bones of the knee are adequately mineralized. The joint spaces
are preserved. There is mild beaking of the tibial spines. The
proximal fibula is intact. The patella appears appropriately
positioned. There is no joint effusion.
IMPRESSION: There is mild degenerative change of the right knee. There is no
acute bony abnormality. If internal derangement is suspected, MRI
would be a useful next imaging step.

## 2016-01-22 ENCOUNTER — Encounter: Payer: Self-pay | Admitting: Internal Medicine

## 2016-01-22 ENCOUNTER — Ambulatory Visit (INDEPENDENT_AMBULATORY_CARE_PROVIDER_SITE_OTHER): Payer: BLUE CROSS/BLUE SHIELD | Admitting: Internal Medicine

## 2016-01-22 VITALS — BP 130/88 | HR 68 | Temp 98.6°F | Ht 71.0 in | Wt 252.0 lb

## 2016-01-22 DIAGNOSIS — F900 Attention-deficit hyperactivity disorder, predominantly inattentive type: Secondary | ICD-10-CM

## 2016-01-22 DIAGNOSIS — Z0000 Encounter for general adult medical examination without abnormal findings: Secondary | ICD-10-CM

## 2016-01-22 DIAGNOSIS — Z23 Encounter for immunization: Secondary | ICD-10-CM

## 2016-01-22 DIAGNOSIS — G479 Sleep disorder, unspecified: Secondary | ICD-10-CM | POA: Insufficient documentation

## 2016-01-22 DIAGNOSIS — I10 Essential (primary) hypertension: Secondary | ICD-10-CM | POA: Diagnosis not present

## 2016-01-22 LAB — CBC WITH DIFFERENTIAL/PLATELET
Basophils Absolute: 0 10*3/uL (ref 0.0–0.1)
Basophils Relative: 0.3 % (ref 0.0–3.0)
EOS PCT: 2.8 % (ref 0.0–5.0)
Eosinophils Absolute: 0.2 10*3/uL (ref 0.0–0.7)
HCT: 46.9 % (ref 39.0–52.0)
HEMOGLOBIN: 15.9 g/dL (ref 13.0–17.0)
Lymphocytes Relative: 22 % (ref 12.0–46.0)
Lymphs Abs: 1.7 10*3/uL (ref 0.7–4.0)
MCHC: 33.9 g/dL (ref 30.0–36.0)
MCV: 88.2 fl (ref 78.0–100.0)
MONO ABS: 0.7 10*3/uL (ref 0.1–1.0)
MONOS PCT: 9.3 % (ref 3.0–12.0)
Neutro Abs: 5 10*3/uL (ref 1.4–7.7)
Neutrophils Relative %: 65.6 % (ref 43.0–77.0)
Platelets: 191 10*3/uL (ref 150.0–400.0)
RBC: 5.32 Mil/uL (ref 4.22–5.81)
RDW: 13.7 % (ref 11.5–15.5)
WBC: 7.7 10*3/uL (ref 4.0–10.5)

## 2016-01-22 LAB — COMPREHENSIVE METABOLIC PANEL
ALBUMIN: 4.3 g/dL (ref 3.5–5.2)
ALK PHOS: 60 U/L (ref 39–117)
ALT: 41 U/L (ref 0–53)
AST: 22 U/L (ref 0–37)
BUN: 20 mg/dL (ref 6–23)
CALCIUM: 9.7 mg/dL (ref 8.4–10.5)
CO2: 32 mEq/L (ref 19–32)
Chloride: 100 mEq/L (ref 96–112)
Creatinine, Ser: 1.09 mg/dL (ref 0.40–1.50)
GFR: 78 mL/min (ref >60.00)
Glucose, Bld: 90 mg/dL (ref 70–99)
POTASSIUM: 4.1 meq/L (ref 3.5–5.1)
Sodium: 138 mEq/L (ref 135–145)
TOTAL PROTEIN: 7 g/dL (ref 6.0–8.3)
Total Bilirubin: 0.6 mg/dL (ref 0.2–1.2)

## 2016-01-22 MED ORDER — TRAZODONE HCL 50 MG PO TABS: 50.0000 mg | ORAL_TABLET | Freq: Every day | ORAL | Status: DC

## 2016-01-22 MED ORDER — AMPHETAMINE-DEXTROAMPHETAMINE 20 MG PO TABS: 20.0000 mg | ORAL_TABLET | Freq: Every day | ORAL | Status: DC

## 2016-01-22 NOTE — Progress Notes (Signed)
Subjective:    Patient ID: John Adkins, male    DOB: Apr 21, 1971, 45 y.o.   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 amphetamine-dextroamphetamine (ADDERALL) 20 MG tablet Take 1-2 tablets (20-40 mg total) by mouth daily. 60 tablet 0  . cyclobenzaprine (FLEXERIL) 10 MG tablet Take 1 tablet (10 mg total) by mouth 3 (three) times daily as needed for muscle spasms. 30 tablet 0  . losartan-hydrochlorothiazide (HYZAAR) 100-25 MG tablet Take 1 tablet by mouth daily. 90 tablet 3   No current facility-administered medications on file prior to visit.    Allergies  Allergen Reactions  . Amphetamine-Dextroamphet Er     REACTION: headache and didn't work well    Past Medical History  Diagnosis Date  . Allergy   . Anxiety   . Hyperlipidemia   . ADHD (attention deficit hyperactivity disorder)   . Diverticulitis   . Abdominal pain   . Wears glasses     wears contacts  . Hypertension     Past Surgical History  Procedure Laterality Date  . Knee surgery  1992    right   . Ankle surgery  1993    left ankle   . Sigmoid colectomy  9/12    Dr Oneida Arenas recurrent diverticulitis  . Shoulder surgery  10/12    Dr Ave Filter    Family History  Problem Relation Age of Onset  . Diabetes Mother   . Heart disease Mother   . Alzheimer's disease Maternal Grandmother   . Heart disease Maternal Grandfather   . Cancer Father     prostate cancer     Social History   Social History  . Marital Status: Married    Spouse Name: N/A  . Number of  Children: 2  . Years of Education: N/A   Occupational History  . Buyer, retail   Social History Main Topics  . Smoking status: Never Smoker   . Smokeless tobacco: Current User    Types: Chew  . Alcohol Use: 0.0 oz/week    0 Standard drinks or equivalent per week     Comment: occasional  . Drug Use: No  . Sexual Activity: Not on file   Other Topics Concern  . Not on file   Social History Narrative   Review of Systems  Constitutional:       Weight up some Did start back with some work outs Wears seat belt  HENT: Negative for dental problem, hearing loss and tinnitus.        Keeps up with dentist  Eyes: Negative for visual disturbance.       No diplopia or unilateral vision loss  Respiratory: Negative for chest tightness and shortness of breath.   Cardiovascular: Negative for chest pain, palpitations and leg swelling.  Gastrointestinal: Negative for nausea, vomiting, abdominal pain, constipation and blood in stool.       No heartburn  Endocrine: Negative for polydipsia and polyuria.  Genitourinary: Negative for urgency and difficulty urinating.  No sexual problems  Musculoskeletal: Positive for arthralgias. Negative for back pain.       Mild chronic knee pain--not bad now  Skin: Negative for pallor and rash.       No suspicious lesions  Allergic/Immunologic: Negative for environmental allergies and immunocompromised state.  Neurological: Negative for dizziness, syncope, weakness, light-headedness and headaches.  Hematological: Negative for adenopathy. Does not bruise/bleed easily.  Psychiatric/Behavioral: Positive for sleep disturbance. Negative for dysphoric mood. The patient is not nervous/anxious.        Objective:   Physical Exam  Constitutional: He is oriented to person, place, and time. He appears well-developed and well-nourished. No distress.  HENT:  Head: Normocephalic and atraumatic.  Right Ear: External ear normal.  Left Ear:  External ear normal.  Mouth/Throat: Oropharynx is clear and moist. No oropharyngeal exudate.  Eyes: Conjunctivae are normal. Pupils are equal, round, and reactive to light.  Neck: Normal range of motion. Neck supple. No thyromegaly present.  Cardiovascular: Normal rate, regular rhythm, normal heart sounds and intact distal pulses.  Exam reveals no gallop.   No murmur heard. Pulmonary/Chest: Effort normal and breath sounds normal. No respiratory distress. He has no wheezes. He has no rales.  Abdominal: Soft. There is no tenderness.  Musculoskeletal: He exhibits no edema or tenderness.  Lymphadenopathy:    He has no cervical adenopathy.  Neurological: He is alert and oriented to person, place, and time.  Skin: No rash noted. No erythema.  Psychiatric: He has a normal mood and affect. His behavior is normal.          Assessment & Plan:

## 2016-01-22 NOTE — Assessment & Plan Note (Signed)
Healthy but needs to work on fitness Strong FH of prostate cancer--but at older ages. No testing for now Flu vaccine

## 2016-01-22 NOTE — Assessment & Plan Note (Signed)
Discussed alternatives Will try trazodone Discussed ambien--but would only give for 2-3 days a week to avoid dependence

## 2016-01-22 NOTE — Assessment & Plan Note (Signed)
Will continue the med for work

## 2016-01-22 NOTE — Addendum Note (Signed)
Addended by: Sueanne Margarita on: 01/22/2016 02:03 PM   Modules accepted: Orders

## 2016-01-22 NOTE — Assessment & Plan Note (Signed)
BP Readings from Last 3 Encounters:  01/22/16 130/88  11/21/15 130/90  11/06/15 160/110   Good control now Due for labs

## 2016-01-22 NOTE — Progress Notes (Signed)
Pre visit review using our clinic review tool, if applicable. No additional management support is needed unless otherwise documented below in the visit note. 

## 2016-01-29 ENCOUNTER — Encounter: Payer: Self-pay | Admitting: Internal Medicine

## 2016-01-31 ENCOUNTER — Other Ambulatory Visit: Payer: Self-pay | Admitting: Family Medicine

## 2016-02-03 ENCOUNTER — Other Ambulatory Visit: Payer: Self-pay | Admitting: Family Medicine

## 2016-02-05 NOTE — Telephone Encounter (Signed)
He is taking HCTZ with the losartan--he doesn't need this separate Rx anymore

## 2016-02-05 NOTE — Telephone Encounter (Signed)
Electronic refill request. This medication is not on patient's current meds list.  Med History reveals #90 on 11/06/2015.  Please advise.

## 2016-02-06 NOTE — Telephone Encounter (Signed)
Refill denied and sent back to pharmacy 

## 2016-02-16 ENCOUNTER — Encounter: Payer: Self-pay | Admitting: Internal Medicine

## 2016-02-16 MED ORDER — TRAZODONE HCL 150 MG PO TABS: 150.0000 mg | ORAL_TABLET | Freq: Every day | ORAL | Status: DC

## 2016-02-26 ENCOUNTER — Encounter: Payer: Self-pay | Admitting: Internal Medicine

## 2016-02-26 ENCOUNTER — Other Ambulatory Visit: Payer: Self-pay | Admitting: Internal Medicine

## 2016-02-26 MED ORDER — AMPHETAMINE-DEXTROAMPHETAMINE 20 MG PO TABS: 20.0000 mg | ORAL_TABLET | Freq: Every day | ORAL | Status: DC

## 2016-02-26 NOTE — Telephone Encounter (Signed)
Last refill 01-22-16 #60/0 Last OV 01-22-16 Next OV 01-22-17 Forwarding to Dr Patsy Lageropland in Dr Karle StarchLetvak's absence. Please send back to me when approved. Thanks

## 2016-02-26 NOTE — Telephone Encounter (Signed)
Spoke to pt. Advised rx was up front ready for pickup 

## 2016-04-09 ENCOUNTER — Other Ambulatory Visit: Payer: Self-pay | Admitting: Family Medicine

## 2016-04-09 MED ORDER — AMPHETAMINE-DEXTROAMPHETAMINE 20 MG PO TABS: 20.0000 mg | ORAL_TABLET | Freq: Every day | ORAL | Status: DC

## 2016-04-09 NOTE — Telephone Encounter (Signed)
Left a vague voice mail on preferred number stating his request was ready for pickup.

## 2016-04-09 NOTE — Telephone Encounter (Signed)
Last office visit 01/22/2016.  Last refilled 02/26/2016 for #60 with no refills.  Ok to refill?

## 2016-05-29 ENCOUNTER — Other Ambulatory Visit: Payer: Self-pay | Admitting: Internal Medicine

## 2016-05-29 MED ORDER — AMPHETAMINE-DEXTROAMPHETAMINE 20 MG PO TABS: 20.0000 mg | ORAL_TABLET | Freq: Every day | ORAL | Status: DC

## 2016-05-29 NOTE — Telephone Encounter (Signed)
Rx up front. Pt made aware through MyChart

## 2016-05-29 NOTE — Telephone Encounter (Signed)
Last written 04-09-16 #60 Last OV 01-22-16 Next OV 01-22-17

## 2016-07-04 ENCOUNTER — Other Ambulatory Visit: Payer: Self-pay | Admitting: Internal Medicine

## 2016-07-05 MED ORDER — AMPHETAMINE-DEXTROAMPHETAMINE 20 MG PO TABS: 20.0000 mg | ORAL_TABLET | Freq: Every day | ORAL | Status: DC

## 2016-07-05 NOTE — Telephone Encounter (Signed)
MyChart message to pt that rx is up front ready for pickup

## 2016-07-05 NOTE — Telephone Encounter (Signed)
Last OV 01-22-16 Next OV 01-22-17

## 2016-07-05 NOTE — Telephone Encounter (Signed)
Last filled 05-29-16

## 2016-08-20 ENCOUNTER — Other Ambulatory Visit: Payer: Self-pay | Admitting: Internal Medicine

## 2016-08-20 NOTE — Telephone Encounter (Signed)
Last printed 07-05-16 Last OV 01-22-16 Next OV 01-22-17

## 2016-08-21 ENCOUNTER — Other Ambulatory Visit: Payer: Self-pay | Admitting: Internal Medicine

## 2016-08-21 MED ORDER — AMPHETAMINE-DEXTROAMPHETAMINE 20 MG PO TABS: 20.0000 mg | ORAL_TABLET | Freq: Every day | ORAL | 0 refills | Status: DC

## 2016-08-21 NOTE — Telephone Encounter (Signed)
MyChart Message sent to pt that rx is up front 

## 2016-10-08 ENCOUNTER — Other Ambulatory Visit: Payer: Self-pay | Admitting: Internal Medicine

## 2016-10-08 MED ORDER — AMPHETAMINE-DEXTROAMPHETAMINE 20 MG PO TABS: 20.0000 mg | ORAL_TABLET | Freq: Every day | ORAL | 0 refills | Status: DC

## 2016-10-08 NOTE — Telephone Encounter (Signed)
Last filled 08-21-16 #60 Last OV 01-22-16 Next OV 01-22-17

## 2016-11-20 ENCOUNTER — Other Ambulatory Visit: Payer: Self-pay | Admitting: Internal Medicine

## 2016-12-11 ENCOUNTER — Other Ambulatory Visit: Payer: Self-pay | Admitting: Internal Medicine

## 2016-12-12 MED ORDER — AMPHETAMINE-DEXTROAMPHETAMINE 20 MG PO TABS: 20.0000 mg | ORAL_TABLET | Freq: Every day | ORAL | 0 refills | Status: DC

## 2016-12-12 NOTE — Telephone Encounter (Signed)
Rx printed and waiting on signature 

## 2016-12-12 NOTE — Telephone Encounter (Signed)
Last written 10-08-16 #60 Last OV 01-22-16 Next OV 01-22-17

## 2016-12-12 NOTE — Telephone Encounter (Signed)
Approved: okay to prepare rx #60 x 0

## 2017-01-22 ENCOUNTER — Ambulatory Visit (INDEPENDENT_AMBULATORY_CARE_PROVIDER_SITE_OTHER): Payer: BLUE CROSS/BLUE SHIELD | Admitting: Internal Medicine

## 2017-01-22 ENCOUNTER — Encounter: Payer: Self-pay | Admitting: Internal Medicine

## 2017-01-22 VITALS — BP 122/82 | HR 66 | Temp 98.5°F | Ht 70.25 in | Wt 236.0 lb

## 2017-01-22 DIAGNOSIS — G479 Sleep disorder, unspecified: Secondary | ICD-10-CM

## 2017-01-22 DIAGNOSIS — I10 Essential (primary) hypertension: Secondary | ICD-10-CM

## 2017-01-22 DIAGNOSIS — Z23 Encounter for immunization: Secondary | ICD-10-CM

## 2017-01-22 DIAGNOSIS — E785 Hyperlipidemia, unspecified: Secondary | ICD-10-CM

## 2017-01-22 DIAGNOSIS — Z0000 Encounter for general adult medical examination without abnormal findings: Secondary | ICD-10-CM

## 2017-01-22 DIAGNOSIS — F900 Attention-deficit hyperactivity disorder, predominantly inattentive type: Secondary | ICD-10-CM

## 2017-01-22 LAB — COMPREHENSIVE METABOLIC PANEL
ALK PHOS: 50 U/L (ref 39–117)
ALT: 30 U/L (ref 0–53)
AST: 17 U/L (ref 0–37)
Albumin: 4.3 g/dL (ref 3.5–5.2)
BILIRUBIN TOTAL: 0.5 mg/dL (ref 0.2–1.2)
BUN: 14 mg/dL (ref 6–23)
CO2: 30 meq/L (ref 19–32)
CREATININE: 1.16 mg/dL (ref 0.40–1.50)
Calcium: 9.3 mg/dL (ref 8.4–10.5)
Chloride: 101 mEq/L (ref 96–112)
GFR: 72.27 mL/min (ref >60.00)
GLUCOSE: 106 mg/dL — AB (ref 70–99)
Potassium: 4 mEq/L (ref 3.5–5.1)
SODIUM: 138 meq/L (ref 135–145)
TOTAL PROTEIN: 6.8 g/dL (ref 6.0–8.3)

## 2017-01-22 LAB — CBC WITH DIFFERENTIAL/PLATELET
Basophils Absolute: 0 10*3/uL (ref 0.0–0.1)
Basophils Relative: 0.3 % (ref 0.0–3.0)
EOS ABS: 0.2 10*3/uL (ref 0.0–0.7)
Eosinophils Relative: 2.7 % (ref 0.0–5.0)
HCT: 46.7 % (ref 39.0–52.0)
Hemoglobin: 16.2 g/dL (ref 13.0–17.0)
LYMPHS ABS: 1.7 10*3/uL (ref 0.7–4.0)
Lymphocytes Relative: 23.5 % (ref 12.0–46.0)
MCHC: 34.8 g/dL (ref 30.0–36.0)
MCV: 87.6 fl (ref 78.0–100.0)
MONO ABS: 0.5 10*3/uL (ref 0.1–1.0)
Monocytes Relative: 7.3 % (ref 3.0–12.0)
NEUTROS ABS: 4.9 10*3/uL (ref 1.4–7.7)
NEUTROS PCT: 66.2 % (ref 43.0–77.0)
PLATELETS: 197 10*3/uL (ref 150.0–400.0)
RBC: 5.33 Mil/uL (ref 4.22–5.81)
RDW: 13.4 % (ref 11.5–15.5)
WBC: 7.3 10*3/uL (ref 4.0–10.5)

## 2017-01-22 LAB — LIPID PANEL
CHOLESTEROL: 176 mg/dL (ref 0–200)
HDL: 27.6 mg/dL — ABNORMAL LOW (ref >39.00)
LDL Cholesterol: 115 mg/dL — ABNORMAL HIGH (ref 0–99)
NONHDL: 148.53
Total CHOL/HDL Ratio: 6
Triglycerides: 168 mg/dL — ABNORMAL HIGH (ref 0.0–149.0)
VLDL: 33.6 mg/dL (ref 0.0–40.0)

## 2017-01-22 MED ORDER — LOSARTAN POTASSIUM-HCTZ 100-25 MG PO TABS: 1.0000 | ORAL_TABLET | Freq: Every day | ORAL | 3 refills | Status: DC

## 2017-01-22 MED ORDER — METHOCARBAMOL 500 MG PO TABS: 500.0000 mg | ORAL_TABLET | Freq: Three times a day (TID) | ORAL | 0 refills | Status: DC | PRN

## 2017-01-22 NOTE — Assessment & Plan Note (Signed)
Not interested in primary prevention 

## 2017-01-22 NOTE — Assessment & Plan Note (Signed)
Fairly healthy Has lost some weight No cancer screening till 50 Flu vaccine--urged him to get in fall this year

## 2017-01-22 NOTE — Assessment & Plan Note (Signed)
Doing better with the trazodone

## 2017-01-22 NOTE — Progress Notes (Signed)
Subjective:    Patient ID: John Adkins, male    DOB: 1971-04-28, 46 y.o.   MRN: [id number]  HPI Here for physical Just had vasectomy 2 days ago--still sore  Still uses adderall intermittently Only takes for work and not every time Only works weekends--but still nights Sleeping fairly well with the trazodone  Has lost 16#---had been more but gained some back Exercising more Trying to eat healthier  No problems with blood pressure Checks once in a while---and it has been okay  Current Outpatient Prescriptions on File Prior to Visit  Medication Sig Dispense Refill  . amphetamine-dextroamphetamine (ADDERALL) 20 MG tablet Take 1-2 tablets (20-40 mg total) by mouth daily. 60 tablet 0  . cyclobenzaprine (FLEXERIL) 10 MG tablet Take 1 tablet (10 mg total) by mouth 3 (three) times daily as needed for muscle spasms. 30 tablet 0  . losartan-hydrochlorothiazide (HYZAAR) 100-25 MG tablet TAKE 1 TABLET BY MOUTH DAILY. 90 tablet 0  . traZODone (DESYREL) 150 MG tablet Take 1 tablet (150 mg total) by mouth at bedtime. 90 tablet 3   No current facility-administered medications on file prior to visit.     No Active Allergies  Past Medical History:  Diagnosis Date  . Abdominal pain   . ADHD (attention deficit hyperactivity disorder)   . Allergy   . Anxiety   . Diverticulitis   . Hyperlipidemia   . Hypertension   . Wears glasses    wears contacts    Past Surgical History:  Procedure Laterality Date  . ANKLE SURGERY  1993   left ankle   . KNEE SURGERY  1992   right   . SHOULDER SURGERY  10/12   Dr Ave Filterhandler  . Sigmoid colectomy  9/12   Dr Oneida ArenasMartin---for recurrent diverticulitis  . VASECTOMY      Family History  Problem Relation Age of Onset  . Diabetes Mother   . Heart disease Mother   . Alzheimer's disease Maternal Grandmother   . Heart disease Maternal Grandfather   . Cancer Father     prostate cancer     Social History   Social History  . Marital status: Married      Spouse name: N/A  . Number of children: 2  . Years of education: N/A   Occupational History  . 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 Smoking status: Never Smoker  . Smokeless tobacco: Current User    Types: Chew  . Alcohol use 0.0 oz/week     Comment: occasional  . Drug use: No  . Sexual activity: Not on file   Other Topics Concern  . Not on file   Social History Narrative  . No narrative on file   Review of Systems  Constitutional: Negative for fatigue.       Wears seat belt  HENT: Negative for dental problem, hearing loss and tinnitus.        Keeps up with dentist  Eyes: Negative for visual disturbance.       No diplopia or unilateral vision loss  Respiratory: Negative for cough, chest tightness and shortness of breath.   Cardiovascular: Negative for chest pain, palpitations and leg swelling.  Gastrointestinal: Negative for abdominal pain, blood in stool, constipation, nausea and vomiting.  Endocrine: Negative for polydipsia and polyuria.  Genitourinary: Negative for difficulty urinating and urgency.       No sexual problems  Musculoskeletal: Positive for back pain. Negative for arthralgias and joint  swelling.       Back will hurt after standing on concrete floors at work for extended time Robaxin helps (more the flexeril)  Skin: Negative for rash.       No suspicious skin lesions  Allergic/Immunologic: Negative for environmental allergies and immunocompromised state.  Neurological: Negative for dizziness, syncope, light-headedness and headaches.  Hematological: Negative for adenopathy. Does not bruise/bleed easily.  Psychiatric/Behavioral: Negative for dysphoric mood. The patient is not nervous/anxious.        Objective:   Physical Exam  Constitutional: He is oriented to person, place, and time. He appears well-developed and well-nourished. No distress.  HENT:  Mouth/Throat: Oropharynx is clear and moist. No oropharyngeal exudate.   Eyes: Conjunctivae are normal. Pupils are equal, round, and reactive to light.  Neck: Normal range of motion. Neck supple. No thyromegaly present.  Cardiovascular: Normal rate, regular rhythm, normal heart sounds and intact distal pulses.  Exam reveals no gallop.   No murmur heard. Pulmonary/Chest: Effort normal and breath sounds normal. No respiratory distress. He has no wheezes. He has no rales.  Abdominal: Soft. There is no tenderness.  Musculoskeletal: He exhibits no edema or tenderness.  Lymphadenopathy:    He has no cervical adenopathy.  Neurological: He is alert and oriented to person, place, and time.  Skin: No rash noted. No erythema.  Psychiatric: He has a normal mood and affect. His behavior is normal.          Assessment & Plan:

## 2017-01-22 NOTE — Assessment & Plan Note (Signed)
BP Readings from Last 3 Encounters:  01/22/17 122/82  01/22/16 130/88  11/21/15 130/90   Good control

## 2017-01-22 NOTE — Progress Notes (Signed)
Pre visit review using our clinic review tool, if applicable. No additional management support is needed unless otherwise documented below in the visit note. 

## 2017-01-22 NOTE — Assessment & Plan Note (Signed)
Uses the adderall intermittently for work

## 2017-01-22 NOTE — Addendum Note (Signed)
Addended by: Eual FinesBRIDGES, SHANNON P on: 01/22/2017 01:08 PM   Modules accepted: Orders

## 2017-02-27 ENCOUNTER — Other Ambulatory Visit: Payer: Self-pay | Admitting: Internal Medicine

## 2017-02-27 MED ORDER — AMPHETAMINE-DEXTROAMPHETAMINE 20 MG PO TABS: 20.0000 mg | ORAL_TABLET | Freq: Every day | ORAL | 0 refills | Status: DC

## 2017-02-27 NOTE — Telephone Encounter (Signed)
Spoke to pt and informed him Rx is available for pickup at the front desk. Pt advised third party unable to pickup 

## 2017-02-27 NOTE — Telephone Encounter (Signed)
Last OV 01/2017. Last filled 12/12/2016

## 2017-02-28 ENCOUNTER — Other Ambulatory Visit (INDEPENDENT_AMBULATORY_CARE_PROVIDER_SITE_OTHER): Payer: BLUE CROSS/BLUE SHIELD

## 2017-02-28 ENCOUNTER — Encounter: Payer: Self-pay | Admitting: Internal Medicine

## 2017-02-28 DIAGNOSIS — Z0283 Encounter for blood-alcohol and blood-drug test: Secondary | ICD-10-CM

## 2017-03-04 LAB — TOXASSURE SELECT 13 (MW), URINE

## 2017-04-19 ENCOUNTER — Other Ambulatory Visit: Payer: Self-pay | Admitting: Internal Medicine

## 2017-04-21 NOTE — Telephone Encounter (Signed)
Received refill electronically Last refill 02/16/16 #90/3 refills Last office visit 01/22/17

## 2017-04-21 NOTE — Telephone Encounter (Signed)
Approved: okay for 1 year 

## 2017-04-21 NOTE — Telephone Encounter (Signed)
Refill sent to pharmacy as instructed. 

## 2017-05-01 ENCOUNTER — Other Ambulatory Visit: Payer: Self-pay | Admitting: Internal Medicine

## 2017-05-01 MED ORDER — AMPHETAMINE-DEXTROAMPHETAMINE 20 MG PO TABS: 20.0000 mg | ORAL_TABLET | Freq: Every day | ORAL | 0 refills | Status: DC

## 2017-05-01 NOTE — Telephone Encounter (Signed)
Last filled 02-27-17 #60 Last OV 01-22-17 Next OV 01-26-18

## 2017-06-30 ENCOUNTER — Other Ambulatory Visit: Payer: Self-pay | Admitting: Internal Medicine

## 2017-06-30 MED ORDER — METHOCARBAMOL 500 MG PO TABS: 500.0000 mg | ORAL_TABLET | Freq: Three times a day (TID) | ORAL | 0 refills | Status: DC | PRN

## 2017-06-30 MED ORDER — AMPHETAMINE-DEXTROAMPHETAMINE 20 MG PO TABS: 20.0000 mg | ORAL_TABLET | Freq: Every day | ORAL | 0 refills | Status: DC

## 2017-06-30 NOTE — Telephone Encounter (Signed)
Methocarbamol last filled 01-22-17 #60 Adderall last filled 05-01-17 #60  Last OV 01-22-17 Next OV 01-26-18

## 2017-08-21 ENCOUNTER — Other Ambulatory Visit: Payer: Self-pay | Admitting: Internal Medicine

## 2017-08-22 MED ORDER — AMPHETAMINE-DEXTROAMPHETAMINE 20 MG PO TABS: 20.0000 mg | ORAL_TABLET | Freq: Every day | ORAL | 0 refills | Status: DC

## 2017-08-22 NOTE — Telephone Encounter (Signed)
Last filled 06-30-17 360 Last OV 01-22-17 Next OV 01-26-18

## 2017-10-09 ENCOUNTER — Other Ambulatory Visit: Payer: Self-pay | Admitting: Internal Medicine

## 2017-10-09 NOTE — Telephone Encounter (Signed)
Last filled 08-22-17 #60 Last OV 01-22-17 Next OV 01-26-18

## 2017-10-10 MED ORDER — AMPHETAMINE-DEXTROAMPHETAMINE 20 MG PO TABS: 20.0000 mg | ORAL_TABLET | Freq: Every day | ORAL | 0 refills | Status: DC

## 2017-10-14 ENCOUNTER — Ambulatory Visit: Payer: BLUE CROSS/BLUE SHIELD

## 2017-12-04 ENCOUNTER — Other Ambulatory Visit: Payer: Self-pay | Admitting: Internal Medicine

## 2017-12-04 MED ORDER — METHOCARBAMOL 500 MG PO TABS: 500.0000 mg | ORAL_TABLET | Freq: Three times a day (TID) | ORAL | 0 refills | Status: DC | PRN

## 2017-12-04 MED ORDER — AMPHETAMINE-DEXTROAMPHETAMINE 20 MG PO TABS: 20.0000 mg | ORAL_TABLET | Freq: Every day | ORAL | 0 refills | Status: DC

## 2017-12-04 NOTE — Telephone Encounter (Signed)
Methocarbamol last filled 06-30-17 #60 Adderall last printed 10-10-17 #60  Last OV 01-22-17 Next OV 01-26-18

## 2018-01-08 ENCOUNTER — Encounter: Payer: Self-pay | Admitting: Internal Medicine

## 2018-01-20 ENCOUNTER — Other Ambulatory Visit: Payer: Self-pay | Admitting: Internal Medicine

## 2018-01-20 MED ORDER — AMPHETAMINE-DEXTROAMPHETAMINE 20 MG PO TABS: 20.0000 mg | ORAL_TABLET | Freq: Every day | ORAL | 0 refills | Status: DC

## 2018-01-20 NOTE — Telephone Encounter (Signed)
Last written 12-04-17 #60 Last OV 02-01-17 Next OV 01-28-18

## 2018-01-26 ENCOUNTER — Encounter: Payer: BLUE CROSS/BLUE SHIELD | Admitting: Internal Medicine

## 2018-01-28 ENCOUNTER — Encounter: Payer: Self-pay | Admitting: Radiology

## 2018-01-28 ENCOUNTER — Ambulatory Visit (INDEPENDENT_AMBULATORY_CARE_PROVIDER_SITE_OTHER): Payer: Managed Care, Other (non HMO) | Admitting: Internal Medicine

## 2018-01-28 ENCOUNTER — Encounter: Payer: Self-pay | Admitting: Internal Medicine

## 2018-01-28 VITALS — BP 110/72 | HR 77 | Temp 98.4°F | Ht 70.25 in | Wt 230.0 lb

## 2018-01-28 DIAGNOSIS — F900 Attention-deficit hyperactivity disorder, predominantly inattentive type: Secondary | ICD-10-CM | POA: Diagnosis not present

## 2018-01-28 DIAGNOSIS — Z23 Encounter for immunization: Secondary | ICD-10-CM

## 2018-01-28 DIAGNOSIS — Z0000 Encounter for general adult medical examination without abnormal findings: Secondary | ICD-10-CM | POA: Diagnosis not present

## 2018-01-28 DIAGNOSIS — G479 Sleep disorder, unspecified: Secondary | ICD-10-CM | POA: Diagnosis not present

## 2018-01-28 DIAGNOSIS — I10 Essential (primary) hypertension: Secondary | ICD-10-CM

## 2018-01-28 LAB — CBC
HEMATOCRIT: 48.2 % (ref 39.0–52.0)
HEMOGLOBIN: 17.1 g/dL — AB (ref 13.0–17.0)
MCHC: 35.4 g/dL (ref 30.0–36.0)
MCV: 87.4 fl (ref 78.0–100.0)
PLATELETS: 217 10*3/uL (ref 150.0–400.0)
RBC: 5.52 Mil/uL (ref 4.22–5.81)
RDW: 13.5 % (ref 11.5–15.5)
WBC: 7.3 10*3/uL (ref 4.0–10.5)

## 2018-01-28 LAB — COMPREHENSIVE METABOLIC PANEL
ALT: 33 U/L (ref 0–53)
AST: 21 U/L (ref 0–37)
Albumin: 4.8 g/dL (ref 3.5–5.2)
Alkaline Phosphatase: 46 U/L (ref 39–117)
BUN: 20 mg/dL (ref 6–23)
CO2: 30 meq/L (ref 19–32)
Calcium: 9.6 mg/dL (ref 8.4–10.5)
Chloride: 99 mEq/L (ref 96–112)
Creatinine, Ser: 1.14 mg/dL (ref 0.40–1.50)
GFR: 73.4 mL/min (ref >60.00)
GLUCOSE: 73 mg/dL (ref 70–99)
POTASSIUM: 4 meq/L (ref 3.5–5.1)
SODIUM: 137 meq/L (ref 135–145)
Total Bilirubin: 0.6 mg/dL (ref 0.2–1.2)
Total Protein: 7.4 g/dL (ref 6.0–8.3)

## 2018-01-28 MED ORDER — TRAZODONE HCL 150 MG PO TABS: 150.0000 mg | ORAL_TABLET | Freq: Every day | ORAL | 3 refills | Status: DC

## 2018-01-28 MED ORDER — LOSARTAN POTASSIUM-HCTZ 100-25 MG PO TABS: 1.0000 | ORAL_TABLET | Freq: Every day | ORAL | 3 refills | Status: DC

## 2018-01-28 NOTE — Progress Notes (Signed)
Subjective:    Patient ID: John Adkins, male    DOB: 05-16-1971, 47 y.o.   MRN: [id number]  HPI Here for physical  Doing fine Has intermittent right back pain---only if he leans into a machine Not often Gets a "hitting a nerve sensation"  Working on fitness Weight down a bit  Continues on the adderall Fills Rx every 6 weeks or so Mostly for work  Current Outpatient Medications on File Prior to Visit  Medication Sig Dispense Refill  . amphetamine-dextroamphetamine (ADDERALL) 20 MG tablet Take 1-2 tablets (20-40 mg total) by mouth daily. 60 tablet 0  . losartan-hydrochlorothiazide (HYZAAR) 100-25 MG tablet Take 1 tablet by mouth daily. 90 tablet 3  . methocarbamol (ROBAXIN) 500 MG tablet Take 1 tablet (500 mg total) by mouth 3 (three) times daily as needed for muscle spasms. 60 tablet 0  . traZODone (DESYREL) 150 MG tablet TAKE 1 TABLET (150 MG TOTAL) BY MOUTH AT BEDTIME. 90 tablet 3   No current facility-administered medications on file prior to visit.     No Known Allergies  Past Medical History:  Diagnosis Date  . Abdominal pain   . ADHD (attention deficit hyperactivity disorder)   . Allergy   . Anxiety   . Diverticulitis   . Hyperlipidemia   . Hypertension   . Wears glasses    wears contacts    Past Surgical History:  Procedure Laterality Date  . ANKLE SURGERY  1993   left ankle   . KNEE SURGERY  1992   right   . SHOULDER SURGERY  10/12   Dr Ave Filter  . Sigmoid colectomy  9/12   Dr Oneida Arenas recurrent diverticulitis  . VASECTOMY      Family History  Problem Relation Age of Onset  . Diabetes Mother   . Heart disease Mother   . Alzheimer's disease Maternal Grandmother   . Heart disease Maternal Grandfather   . Cancer Father        prostate cancer     Social History   Socioeconomic History  . Marital status: Married    Spouse name: Not on file  . Number of children: 2  . Years of education: Not on file  . Highest education level: Not on  file  Social Needs  . Financial resource strain: Not on file  . Food insecurity - worry: Not on file  . Food insecurity - inability: Not on file  . Transportation needs - medical: Not on file  . Transportation needs - non-medical: Not on file  Occupational History  . Occupation: Chartered certified accountant    CommentSet designer  Tobacco Use  . Smoking status: Never Smoker  . Smokeless tobacco: Current User    Types: Chew  Substance and Sexual Activity  . Alcohol use: Yes    Alcohol/week: 0.0 oz    Comment: occasional  . Drug use: No  . Sexual activity: Not on file  Other Topics Concern  . Not on file  Social History Narrative  . Not on file   Review of Systems  Constitutional: Negative for fatigue.       Wears seat belt  HENT: Negative for dental problem, hearing loss and tinnitus.        Keeps up with dentist  Eyes: Negative for visual disturbance.       No diplopia or unilateral vision loss  Respiratory: Negative for cough, chest tightness and shortness of breath.   Cardiovascular: Negative for chest pain, palpitations and leg swelling.  Gastrointestinal: Negative for blood in stool and constipation.       No heartburn  Endocrine: Negative for polydipsia and polyuria.  Genitourinary: Negative for difficulty urinating and urgency.       No sexual problems  Musculoskeletal: Positive for back pain. Negative for arthralgias and joint swelling.  Skin: Negative for rash.       No suspicious lesions  Allergic/Immunologic: Negative for environmental allergies and immunocompromised state.  Neurological: Negative for dizziness, syncope, light-headedness and headaches.  Hematological: Negative for adenopathy. Does not bruise/bleed easily.  Psychiatric/Behavioral: Negative for dysphoric mood and sleep disturbance. The patient is not nervous/anxious.        Objective:   Physical Exam  Constitutional: He is oriented to person, place, and time. He appears well-developed and  well-nourished. No distress.  HENT:  Head: Normocephalic and atraumatic.  Right Ear: External ear normal.  Left Ear: External ear normal.  Mouth/Throat: Oropharynx is clear and moist. No oropharyngeal exudate.  Eyes: Conjunctivae are normal. Pupils are equal, round, and reactive to light.  Neck: No thyromegaly present.  Cardiovascular: Normal rate, regular rhythm, normal heart sounds and intact distal pulses. Exam reveals no gallop.  No murmur heard. Pulmonary/Chest: Effort normal and breath sounds normal. No respiratory distress. He has no wheezes. He has no rales.  Abdominal: Soft. There is no tenderness.  Musculoskeletal: He exhibits no edema or tenderness.  Lymphadenopathy:    He has no cervical adenopathy.  Neurological: He is alert and oriented to person, place, and time.  Skin: No rash noted. No erythema.  Psychiatric: He has a normal mood and affect. His behavior is normal.          Assessment & Plan:

## 2018-01-28 NOTE — Assessment & Plan Note (Signed)
Okay with trazodone 

## 2018-01-28 NOTE — Addendum Note (Signed)
Addended by: Eual FinesBRIDGES, SHANNON P on: 01/28/2018 03:29 PM   Modules accepted: Orders

## 2018-01-28 NOTE — Assessment & Plan Note (Signed)
Healthy Getting back to fitness Tdap and flu vaccines today No cancer screening till 4250

## 2018-01-28 NOTE — Assessment & Plan Note (Signed)
BP Readings from Last 3 Encounters:  01/28/18 110/72  01/22/17 122/82  01/22/16 130/88   No problems with med

## 2018-01-28 NOTE — Assessment & Plan Note (Signed)
Uses the adderall for work 

## 2018-01-31 LAB — PAIN MGMT, PROFILE 8 W/CONF, U
6 Acetylmorphine: NEGATIVE ng/mL (ref <10)
ALCOHOL METABOLITES: NEGATIVE ng/mL (ref <500)
Amphetamine: 2016 ng/mL — ABNORMAL HIGH (ref <250)
Amphetamines: POSITIVE ng/mL — AB (ref <500)
Benzodiazepines: NEGATIVE ng/mL (ref <100)
Buprenorphine, Urine: NEGATIVE ng/mL (ref <5)
CREATININE: 239.8 mg/dL
Cocaine Metabolite: NEGATIVE ng/mL (ref <150)
MARIJUANA METABOLITE: NEGATIVE ng/mL (ref <20)
MDMA: NEGATIVE ng/mL (ref <500)
METHAMPHETAMINE: NEGATIVE ng/mL (ref <250)
OPIATES: NEGATIVE ng/mL (ref <100)
OXIDANT: NEGATIVE ug/mL (ref <200)
OXYCODONE: NEGATIVE ng/mL (ref <100)
PH: 5.93 (ref 4.5–9.0)

## 2018-03-25 ENCOUNTER — Other Ambulatory Visit: Payer: Self-pay | Admitting: Internal Medicine

## 2018-03-25 MED ORDER — AMPHETAMINE-DEXTROAMPHETAMINE 20 MG PO TABS: 20.0000 mg | ORAL_TABLET | Freq: Every day | ORAL | 0 refills | Status: DC

## 2018-03-25 NOTE — Telephone Encounter (Signed)
Last filled 01-20-18 #60 Last OV 01-28-18 Next OV 02-03-19

## 2018-05-28 ENCOUNTER — Other Ambulatory Visit: Payer: Self-pay | Admitting: Internal Medicine

## 2018-05-28 MED ORDER — METHOCARBAMOL 500 MG PO TABS: 500.0000 mg | ORAL_TABLET | Freq: Three times a day (TID) | ORAL | 0 refills | Status: DC | PRN

## 2018-05-28 MED ORDER — AMPHETAMINE-DEXTROAMPHETAMINE 20 MG PO TABS: 20.0000 mg | ORAL_TABLET | Freq: Every day | ORAL | 0 refills | Status: DC

## 2018-05-28 NOTE — Telephone Encounter (Signed)
Name of Medication: Robaxin  Name of Pharmacy: [facility] Last Fill or Written Date and Quantity: # 60 on 12/04/17 Last Office Visit and Type: 01/28/18 for annual exam Next Office Visit and Type: 02/03/2019 for annual exam Last Controlled Substance Agreement Date: 01/28/18 Last UDS:01/28/18  Name of Mediation: Adderall 20 mg Name of pharmacy: [facility] [place] Written Date and Quantity: #60 on 03/25/18 Last office visit and type; 01/28/18 for annual exam Next Office visit and type: 02/03/2019 for annual exam Last controlled substance agreement date: 01/28/18 Last UDS:01/28/18

## 2018-06-19 ENCOUNTER — Other Ambulatory Visit: Payer: Self-pay | Admitting: Internal Medicine

## 2018-06-19 NOTE — Telephone Encounter (Signed)
Approved: #60 x 0 

## 2018-07-20 ENCOUNTER — Other Ambulatory Visit: Payer: Self-pay | Admitting: Internal Medicine

## 2018-07-20 MED ORDER — AMPHETAMINE-DEXTROAMPHETAMINE 20 MG PO TABS: 20.0000 mg | ORAL_TABLET | Freq: Every day | ORAL | 0 refills | Status: DC

## 2018-07-20 NOTE — Telephone Encounter (Signed)
Name of Medication: Adderall 20 mg [facility] [place] or Written Date and Quantity: #60 on 05/28/18 Last Office Visit and Type: 01/28/18 annual Next Office Visit and Type: 02/03/19 CPX

## 2018-09-10 ENCOUNTER — Other Ambulatory Visit: Payer: Self-pay

## 2018-09-10 NOTE — Telephone Encounter (Signed)
Name of Medication: Adderall 20 mg Name of Pharmacy: [facility] Last [place] or Written Date and Quantity:# 60 on 07/20/18  Last Office Visit and Type: 01/28/18 annual Next Office Visit and Type: 02/03/19 CPX Last Controlled Substance Agreement Date: 01/28/18 Last UDS:01/28/18

## 2018-09-11 MED ORDER — AMPHETAMINE-DEXTROAMPHETAMINE 20 MG PO TABS: 20.0000 mg | ORAL_TABLET | Freq: Every day | ORAL | 0 refills | Status: DC

## 2018-11-02 ENCOUNTER — Other Ambulatory Visit: Payer: Self-pay

## 2018-11-02 MED ORDER — AMPHETAMINE-DEXTROAMPHETAMINE 20 MG PO TABS: 20.0000 mg | ORAL_TABLET | Freq: Every day | ORAL | 0 refills | Status: DC

## 2018-11-02 NOTE — Telephone Encounter (Signed)
Last filled 09-11-18 #60 Last OV 01-28-18 Next OV 02-03-19 [facility]

## 2018-12-22 ENCOUNTER — Other Ambulatory Visit: Payer: Self-pay

## 2018-12-22 NOTE — Telephone Encounter (Signed)
Last filled 11-02-18 #60 Last OV 01-28-18 Next OV 02-03-19 [facility]

## 2018-12-23 MED ORDER — AMPHETAMINE-DEXTROAMPHETAMINE 20 MG PO TABS: 20.0000 mg | ORAL_TABLET | Freq: Every day | ORAL | 0 refills | Status: DC

## 2019-02-03 ENCOUNTER — Encounter: Payer: Self-pay | Admitting: Internal Medicine

## 2019-02-03 ENCOUNTER — Ambulatory Visit (INDEPENDENT_AMBULATORY_CARE_PROVIDER_SITE_OTHER): Payer: Managed Care, Other (non HMO) | Admitting: Internal Medicine

## 2019-02-03 VITALS — BP 118/82 | HR 75 | Temp 97.5°F | Ht 70.25 in | Wt 232.0 lb

## 2019-02-03 DIAGNOSIS — I10 Essential (primary) hypertension: Secondary | ICD-10-CM | POA: Diagnosis not present

## 2019-02-03 DIAGNOSIS — Z0000 Encounter for general adult medical examination without abnormal findings: Secondary | ICD-10-CM | POA: Diagnosis not present

## 2019-02-03 DIAGNOSIS — F900 Attention-deficit hyperactivity disorder, predominantly inattentive type: Secondary | ICD-10-CM

## 2019-02-03 LAB — COMPREHENSIVE METABOLIC PANEL
ALK PHOS: 41 U/L (ref 39–117)
ALT: 32 U/L (ref 0–53)
AST: 20 U/L (ref 0–37)
Albumin: 4.8 g/dL (ref 3.5–5.2)
BILIRUBIN TOTAL: 0.7 mg/dL (ref 0.2–1.2)
BUN: 27 mg/dL — ABNORMAL HIGH (ref 6–23)
CALCIUM: 9.7 mg/dL (ref 8.4–10.5)
CO2: 31 meq/L (ref 19–32)
CREATININE: 1.3 mg/dL (ref 0.40–1.50)
Chloride: 99 mEq/L (ref 96–112)
GFR: 59.09 mL/min — ABNORMAL LOW (ref >60.00)
GLUCOSE: 80 mg/dL (ref 70–99)
Potassium: 4.1 mEq/L (ref 3.5–5.1)
Sodium: 137 mEq/L (ref 135–145)
TOTAL PROTEIN: 7.2 g/dL (ref 6.0–8.3)

## 2019-02-03 LAB — CBC
HCT: 48.1 % (ref 39.0–52.0)
Hemoglobin: 17 g/dL (ref 13.0–17.0)
MCHC: 35.3 g/dL (ref 30.0–36.0)
MCV: 87.3 fl (ref 78.0–100.0)
Platelets: 182 10*3/uL (ref 150.0–400.0)
RBC: 5.5 Mil/uL (ref 4.22–5.81)
RDW: 13.3 % (ref 11.5–15.5)
WBC: 4.9 10*3/uL (ref 4.0–10.5)

## 2019-02-03 NOTE — Assessment & Plan Note (Signed)
Healthy Needs to work better on fitness Yearly flu vaccine ---can get at work Colon cancer screening at 42

## 2019-02-03 NOTE — Progress Notes (Signed)
Subjective:    Patient ID: John Adkins, male    DOB: Jan 27, 1971, 48 y.o.   MRN: [id number]  HPI Here for physical  Notices more muscle soreness---like holding things up in the air "My legs will turn to jelly" Hasn't been working out lately Is on day shift for a while---sleeps better  Uses the adderall for work Still helps his focus (especially for the tedious work he sometimes does) It helps keep him from getting antsy  Current Outpatient Medications on File Prior to Visit  Medication Sig Dispense Refill  . amphetamine-dextroamphetamine (ADDERALL) 20 MG tablet Take 1-2 tablets (20-40 mg total) by mouth daily. 60 tablet 0  . losartan-hydrochlorothiazide (HYZAAR) 100-25 MG tablet Take 1 tablet by mouth daily. 90 tablet 3  . methocarbamol (ROBAXIN) 500 MG tablet TAKE 1 TABLET (500 MG TOTAL) BY MOUTH 3 (THREE) TIMES DAILY AS NEEDED FOR MUSCLE SPASMS. 60 tablet 0  . traZODone (DESYREL) 150 MG tablet Take 1 tablet (150 mg total) by mouth at bedtime. 90 tablet 3   No current facility-administered medications on file prior to visit.     No Known Allergies  Past Medical History:  Diagnosis Date  . Abdominal pain   . ADHD (attention deficit hyperactivity disorder)   . Allergy   . Anxiety   . Diverticulitis   . Hyperlipidemia   . Hypertension   . Wears glasses    wears contacts    Past Surgical History:  Procedure Laterality Date  . ANKLE SURGERY  1993   left ankle   . KNEE SURGERY  1992   right   . SHOULDER SURGERY  10/12   Dr Ave Filter  . Sigmoid colectomy  9/12   Dr Oneida Arenas recurrent diverticulitis  . VASECTOMY      Family History  Problem Relation Age of Onset  . Diabetes Mother   . Heart disease Mother   . Alzheimer's disease Maternal Grandmother   . Heart disease Maternal Grandfather   . Cancer Father        prostate cancer     Social History   Socioeconomic History  . Marital status: Married    Spouse name: Not on file  . Number of children: 2   . Years of education: Not on file  . Highest education level: Not on file  Occupational History  . Occupation: Chartered certified accountant    CommentSet designer  Social Needs  . Financial resource strain: Not on file  . Food insecurity:    Worry: Not on file    Inability: Not on file  . Transportation needs:    Medical: Not on file    Non-medical: Not on file  Tobacco Use  . Smoking status: Never Smoker  . Smokeless tobacco: Current User    Types: Chew  Substance and Sexual Activity  . Alcohol use: Yes    Alcohol/week: 0.0 standard drinks    Comment: occasional  . Drug use: No  . Sexual activity: Not on file  Lifestyle  . Physical activity:    Days per week: Not on file    Minutes per session: Not on file  . Stress: Not on file  Relationships  . Social connections:    Talks on phone: Not on file    Gets together: Not on file    Attends religious service: Not on file    Active member of club or organization: Not on file    Attends meetings of clubs or organizations: Not on file  Relationship status: Not on file  . Intimate partner violence:    Fear of current or ex partner: Not on file    Emotionally abused: Not on file    Physically abused: Not on file    Forced sexual activity: Not on file  Other Topics Concern  . Not on file  Social History Narrative  . Not on file   Review of Systems  Constitutional: Negative for unexpected weight change.       Had gained 25# but lost it with low carb eating Wears seat belt  HENT: Negative for dental problem, hearing loss and tinnitus.        Keeps up with dentist  Eyes: Negative for visual disturbance.       No diplopia or unilateral vision loss  Respiratory: Negative for cough, chest tightness and shortness of breath.   Cardiovascular: Negative for chest pain, palpitations and leg swelling.  Gastrointestinal: Negative for blood in stool and constipation.       No heartburn ---unless he eats late (tries to avoid)  Endocrine:  Negative for polydipsia and polyuria.  Genitourinary: Negative for difficulty urinating and urgency.       No sexual problems  Musculoskeletal: Negative for back pain and joint swelling.       Some hand aching  Skin: Negative for rash.  Allergic/Immunologic: Negative for environmental allergies and immunocompromised state.  Neurological: Negative for dizziness, syncope and headaches.  Hematological: Negative for adenopathy. Does not bruise/bleed easily.  Psychiatric/Behavioral: Negative for dysphoric mood and sleep disturbance. The patient is not nervous/anxious.        Objective:   Physical Exam  Constitutional: He is oriented to person, place, and time. He appears well-developed. No distress.  HENT:  Head: Normocephalic and atraumatic.  Right Ear: External ear normal.  Left Ear: External ear normal.  Mouth/Throat: Oropharynx is clear and moist. No oropharyngeal exudate.  Eyes: Pupils are equal, round, and reactive to light. Conjunctivae are normal.  Neck: No thyromegaly present.  Cardiovascular: Normal rate, regular rhythm, normal heart sounds and intact distal pulses. Exam reveals no gallop.  No murmur heard. Respiratory: Effort normal and breath sounds normal. No respiratory distress. He has no wheezes. He has no rales.  GI: Soft. There is no abdominal tenderness.  Musculoskeletal:        General: No tenderness or edema.  Lymphadenopathy:    He has no cervical adenopathy.  Neurological: He is alert and oriented to person, place, and time.  Skin: No rash noted. No erythema.  Psychiatric: He has a normal mood and affect. His behavior is normal.           Assessment & Plan:

## 2019-02-03 NOTE — Assessment & Plan Note (Signed)
Uses the adderall for work 

## 2019-02-03 NOTE — Assessment & Plan Note (Signed)
BP Readings from Last 3 Encounters:  02/03/19 118/82  01/28/18 110/72  01/22/17 122/82   Good control

## 2019-02-09 ENCOUNTER — Other Ambulatory Visit: Payer: Self-pay

## 2019-02-09 MED ORDER — AMPHETAMINE-DEXTROAMPHETAMINE 20 MG PO TABS: 20.0000 mg | ORAL_TABLET | Freq: Every day | ORAL | 0 refills | Status: DC

## 2019-02-09 NOTE — Telephone Encounter (Signed)
Please advise on refill.

## 2019-02-09 NOTE — Telephone Encounter (Signed)
Patient of Dr. Alphonsus Sias.

## 2019-02-28 ENCOUNTER — Other Ambulatory Visit: Payer: Self-pay | Admitting: Internal Medicine

## 2019-03-30 ENCOUNTER — Other Ambulatory Visit: Payer: Self-pay | Admitting: Internal Medicine

## 2019-03-30 ENCOUNTER — Other Ambulatory Visit: Payer: Self-pay

## 2019-03-30 MED ORDER — AMPHETAMINE-DEXTROAMPHETAMINE 20 MG PO TABS: 20.0000 mg | ORAL_TABLET | Freq: Every day | ORAL | 0 refills | Status: DC

## 2019-03-30 NOTE — Telephone Encounter (Signed)
Last filled 02-09-19 #60 Last OV 02-03-19 Next OV 02-09-20 [facility]

## 2019-06-01 ENCOUNTER — Other Ambulatory Visit: Payer: Self-pay

## 2019-06-01 MED ORDER — AMPHETAMINE-DEXTROAMPHETAMINE 20 MG PO TABS: 20.0000 mg | ORAL_TABLET | Freq: Every day | ORAL | 0 refills | Status: DC

## 2019-06-01 MED ORDER — METHOCARBAMOL 500 MG PO TABS: 500.0000 mg | ORAL_TABLET | Freq: Three times a day (TID) | ORAL | 0 refills | Status: DC | PRN

## 2019-06-01 NOTE — Telephone Encounter (Signed)
Adderall last filled 03-30-19 #60 Methocarbamol last filled 06-19-18 #60 Last OV 02-03-19 Next OV 03-30-19 [facility]

## 2019-07-27 ENCOUNTER — Other Ambulatory Visit: Payer: Self-pay

## 2019-07-27 MED ORDER — AMPHETAMINE-DEXTROAMPHETAMINE 20 MG PO TABS: 20.0000 mg | ORAL_TABLET | Freq: Every day | ORAL | 0 refills | Status: DC

## 2019-07-27 NOTE — Telephone Encounter (Signed)
Last filled 06-01-19 #60 Last OV 02-03-19 Next OV 02-09-20 [facility]

## 2019-09-20 ENCOUNTER — Other Ambulatory Visit: Payer: Self-pay

## 2019-09-20 NOTE — Telephone Encounter (Signed)
Last filled 07-27-19 #60 Last OV 02-03-19 Next OV 02-09-20 [facility]

## 2019-09-21 MED ORDER — AMPHETAMINE-DEXTROAMPHETAMINE 20 MG PO TABS: 20.0000 mg | ORAL_TABLET | Freq: Every day | ORAL | 0 refills | Status: DC

## 2019-11-08 ENCOUNTER — Other Ambulatory Visit: Payer: Self-pay

## 2019-11-09 MED ORDER — AMPHETAMINE-DEXTROAMPHETAMINE 20 MG PO TABS: 20.0000 mg | ORAL_TABLET | Freq: Every day | ORAL | 0 refills | Status: DC

## 2019-11-09 NOTE — Telephone Encounter (Signed)
Last filled 09-21-19 #60 Last OV 02-03-19 Next OV 02-09-20 [facility]

## 2019-12-20 ENCOUNTER — Ambulatory Visit: Payer: Managed Care, Other (non HMO) | Attending: Internal Medicine

## 2019-12-20 DIAGNOSIS — Z20822 Contact with and (suspected) exposure to covid-19: Secondary | ICD-10-CM

## 2019-12-21 LAB — NOVEL CORONAVIRUS, NAA: SARS-CoV-2, NAA: DETECTED — AB

## 2020-01-17 ENCOUNTER — Other Ambulatory Visit: Payer: Self-pay

## 2020-01-17 MED ORDER — AMPHETAMINE-DEXTROAMPHETAMINE 20 MG PO TABS: 20.0000 mg | ORAL_TABLET | Freq: Every day | ORAL | 0 refills | Status: DC

## 2020-01-17 NOTE — Telephone Encounter (Signed)
Last office visit 02/03/2019 for CPE.  Last refilled 11/09/2019 for #60 with no refills.  UDS/[place] 01/28/2018.  CPE scheduled for 02/09/2020.

## 2020-02-09 ENCOUNTER — Encounter: Payer: Managed Care, Other (non HMO) | Admitting: Internal Medicine

## 2020-03-08 ENCOUNTER — Other Ambulatory Visit: Payer: Self-pay

## 2020-03-09 MED ORDER — AMPHETAMINE-DEXTROAMPHETAMINE 20 MG PO TABS: 20.0000 mg | ORAL_TABLET | Freq: Every day | ORAL | 0 refills | Status: DC

## 2020-03-09 NOTE — Telephone Encounter (Signed)
Last filled 01-17-20 #60 Last OV 02-03-19 Next OV 03-15-20 [facility]

## 2020-03-15 ENCOUNTER — Encounter: Payer: Managed Care, Other (non HMO) | Admitting: Internal Medicine

## 2020-04-18 ENCOUNTER — Other Ambulatory Visit: Payer: Self-pay

## 2020-04-18 MED ORDER — AMPHETAMINE-DEXTROAMPHETAMINE 20 MG PO TABS: 20.0000 mg | ORAL_TABLET | Freq: Every day | ORAL | 0 refills | Status: DC

## 2020-04-18 NOTE — Telephone Encounter (Signed)
Last written 02-15-20 #60 Last OV 02-03-19 Next OV 04-24-20 [facility]

## 2020-04-24 ENCOUNTER — Ambulatory Visit (INDEPENDENT_AMBULATORY_CARE_PROVIDER_SITE_OTHER): Payer: Managed Care, Other (non HMO) | Admitting: Internal Medicine

## 2020-04-24 ENCOUNTER — Other Ambulatory Visit: Payer: Self-pay

## 2020-04-24 ENCOUNTER — Encounter: Payer: Self-pay | Admitting: Internal Medicine

## 2020-04-24 VITALS — BP 130/84 | HR 82 | Temp 97.6°F | Ht 70.0 in | Wt 249.0 lb

## 2020-04-24 DIAGNOSIS — Z0000 Encounter for general adult medical examination without abnormal findings: Secondary | ICD-10-CM | POA: Diagnosis not present

## 2020-04-24 DIAGNOSIS — Z1211 Encounter for screening for malignant neoplasm of colon: Secondary | ICD-10-CM

## 2020-04-24 DIAGNOSIS — I10 Essential (primary) hypertension: Secondary | ICD-10-CM | POA: Diagnosis not present

## 2020-04-24 DIAGNOSIS — F900 Attention-deficit hyperactivity disorder, predominantly inattentive type: Secondary | ICD-10-CM

## 2020-04-24 DIAGNOSIS — G479 Sleep disorder, unspecified: Secondary | ICD-10-CM

## 2020-04-24 LAB — COMPREHENSIVE METABOLIC PANEL
ALT: 52 U/L (ref 0–53)
AST: 28 U/L (ref 0–37)
Albumin: 4.5 g/dL (ref 3.5–5.2)
Alkaline Phosphatase: 53 U/L (ref 39–117)
BUN: 11 mg/dL (ref 6–23)
CO2: 32 mEq/L (ref 19–32)
Calcium: 9.5 mg/dL (ref 8.4–10.5)
Chloride: 97 mEq/L (ref 96–112)
Creatinine, Ser: 1.18 mg/dL (ref 0.40–1.50)
GFR: 65.73 mL/min (ref >60.00)
Glucose, Bld: 164 mg/dL — ABNORMAL HIGH (ref 70–99)
Potassium: 3.6 mEq/L (ref 3.5–5.1)
Sodium: 135 mEq/L (ref 135–145)
Total Bilirubin: 0.6 mg/dL (ref 0.2–1.2)
Total Protein: 7.2 g/dL (ref 6.0–8.3)

## 2020-04-24 LAB — CBC
HCT: 47.1 % (ref 39.0–52.0)
Hemoglobin: 16.8 g/dL (ref 13.0–17.0)
MCHC: 35.7 g/dL (ref 30.0–36.0)
MCV: 87.5 fl (ref 78.0–100.0)
Platelets: 173 10*3/uL (ref 150.0–400.0)
RBC: 5.38 Mil/uL (ref 4.22–5.81)
RDW: 13.4 % (ref 11.5–15.5)
WBC: 7.4 10*3/uL (ref 4.0–10.5)

## 2020-04-24 MED ORDER — METHOCARBAMOL 500 MG PO TABS: 500.0000 mg | ORAL_TABLET | Freq: Three times a day (TID) | ORAL | 0 refills | Status: DC | PRN

## 2020-04-24 MED ORDER — TRAZODONE HCL 150 MG PO TABS: 150.0000 mg | ORAL_TABLET | Freq: Every day | ORAL | 3 refills | Status: DC

## 2020-04-24 NOTE — Progress Notes (Signed)
Subjective:    Patient ID: John Adkins, male    DOB: [birth year], 49 y.o.   MRN: [id number]  HPI Here for physical This visit occurred during the SARS-CoV-2 public health emergency.  Safety protocols were in place, including screening questions prior to the visit, additional usage of staff PPE, and extensive cleaning of exam room while observing appropriate contact time as indicated for disinfecting solutions.   Had mild respiratory illness with COVID in January Not ready to take vaccine  Has changed jobs --now in [place] Working 1st shift--still getting used to this (weekends)  Continues to use the adderall just for work Still helps him focus  Uses the trazodone at night---when he is due to work the next day Muscle relaxer prn---recent neck pain when he did the wrong thing at work  Current Outpatient Medications on File Prior to Visit  Medication Sig Dispense Refill  . amphetamine-dextroamphetamine (ADDERALL) 20 MG tablet Take 1-2 tablets (20-40 mg total) by mouth daily. 60 tablet 0  . losartan-hydrochlorothiazide (HYZAAR) 100-25 MG tablet TAKE 1 TABLET BY MOUTH EVERY DAY 90 tablet 3  . methocarbamol (ROBAXIN) 500 MG tablet Take 1 tablet (500 mg total) by mouth 3 (three) times daily as needed for muscle spasms. 60 tablet 0  . traZODone (DESYREL) 150 MG tablet TAKE 1 TABLET (150 MG TOTAL) BY MOUTH AT BEDTIME. 90 tablet 3   No current facility-administered medications on file prior to visit.    No Known Allergies  Past Medical History:  Diagnosis Date  . Abdominal pain   . ADHD (attention deficit hyperactivity disorder)   . Allergy   . Anxiety   . Diverticulitis   . Hyperlipidemia   . Hypertension   . Wears glasses    wears contacts    Past Surgical History:  Procedure Laterality Date  . ANKLE SURGERY  1993   left ankle   . [place]   right   . SHOULDER SURGERY  10/12   Dr Tamera Punt  . Sigmoid colectomy  9/12   Dr Waunita Schooner recurrent  diverticulitis  . VASECTOMY      Family History  Problem Relation Age of Onset  . Diabetes Mother   . Heart disease Mother   . Alzheimer's disease Maternal Grandmother   . Heart disease Maternal Grandfather   . Cancer Father        prostate cancer     Social History   Socioeconomic History  . Marital status: Married    Spouse name: Not on file  . Number of children: 2  . Years of education: Not on file  . Highest education level: Not on file  Occupational History  . Occupation: Furniture conservator/restorer    Comment:    Tobacco Use  . Smoking status: Never Smoker  . Smokeless tobacco: Current User    Types: Chew  Substance and Sexual Activity  . Alcohol use: Yes    Alcohol/week: 0.0 standard drinks    Comment: occasional  . Drug use: No  . Sexual activity: Not on file  Other Topics Concern  . Not on file  Social History Narrative  . Not on file   Social Determinants of Health   Financial Resource Strain:   . Difficulty of Paying Living Expenses:   Food Insecurity:   . Worried About Charity fundraiser in the Last Year:   . Arboriculturist in the Last Year:   Transportation Needs:   . Film/video editor (Medical):   Marland Kitchen  Lack of Transportation (Non-Medical):   Physical Activity:   . Days of Exercise per Week:   . Minutes of Exercise per Session:   Stress:   . Feeling of Stress :   Social Connections:   . Frequency of Communication with Friends and Family:   . Frequency of Social Gatherings with Friends and Family:   . Attends Religious Services:   . Active Member of Clubs or Organizations:   . Attends Banker Meetings:   Marland Kitchen Marital Status:   Intimate Partner Violence:   . Fear of Current or Ex-Partner:   . Emotionally Abused:   Marland Kitchen Physically Abused:   . Sexually Abused:    Review of Systems  Constitutional:       Gained back quite a bit of weight--attributes it to COVID Wears seat belt   HENT: Negative for dental problem, hearing loss and tinnitus.          Keeps up with dentist  Eyes: Negative for visual disturbance.       No diplopia or unilateral vision  Respiratory: Negative for cough, chest tightness and shortness of breath.   Cardiovascular: Negative for chest pain, palpitations and leg swelling.  Gastrointestinal: Negative for abdominal pain, blood in stool and constipation.       No recent heartburn  Endocrine: Negative for polydipsia and polyuria.  Genitourinary: Negative for difficulty urinating and urgency.       No sexual problems  Musculoskeletal: Negative for joint swelling.       Mild hand pain Some back/neck pain at times Aleve prn helps  Skin: Negative for rash.       Vitiligo on hands and feet/legs  Allergic/Immunologic: Positive for environmental allergies. Negative for immunocompromised state.       Mild Uses flonase in season if it is bad  Neurological: Negative for dizziness, syncope, light-headedness and headaches.  Hematological: Negative for adenopathy. Does not bruise/bleed easily.  Psychiatric/Behavioral: Negative for dysphoric mood and sleep disturbance. The patient is not nervous/anxious.        Objective:   Physical Exam  Constitutional: He is oriented to person, place, and time. He appears well-developed. No distress.  HENT:  Head: Normocephalic and atraumatic.  Right Ear: External ear normal.  Left Ear: External ear normal.  Mouth/Throat: Oropharynx is clear and moist. No oropharyngeal exudate.  Eyes: Pupils are equal, round, and reactive to light. Conjunctivae are normal.  Neck: No thyromegaly present.  Cardiovascular: Normal rate, regular rhythm, normal heart sounds and intact distal pulses. Exam reveals no gallop.  No murmur heard. Respiratory: Effort normal and breath sounds normal. No respiratory distress. He has no wheezes. He has no rales.  GI: Soft. There is no abdominal tenderness.  Musculoskeletal:        General: No tenderness or edema.  Lymphadenopathy:    He has no cervical  adenopathy.  Neurological: He is alert and oriented to person, place, and time.  Skin: No rash noted.  Psychiatric: He has a normal mood and affect. His behavior is normal.           Assessment & Plan:

## 2020-04-24 NOTE — Assessment & Plan Note (Signed)
BP Readings from Last 3 Encounters:  04/24/20 130/84  02/03/19 118/82  01/28/18 110/72   Good control Will check labs

## 2020-04-24 NOTE — Assessment & Plan Note (Signed)
Healthy Needs to work on fitness Opposed to COVID vaccine Will get flu vaccine Discussed colon cancer screening--will check FIT

## 2020-04-24 NOTE — Assessment & Plan Note (Signed)
May improve with change to 1st shift --but still adjusting Uses the trazodone prn

## 2020-04-24 NOTE — Assessment & Plan Note (Signed)
Uses the adderall for work 

## 2020-04-25 ENCOUNTER — Other Ambulatory Visit: Payer: Self-pay | Admitting: Internal Medicine

## 2020-04-25 DIAGNOSIS — R7309 Other abnormal glucose: Secondary | ICD-10-CM

## 2020-05-08 MED ORDER — LOSARTAN POTASSIUM-HCTZ 100-25 MG PO TABS: 1.0000 | ORAL_TABLET | Freq: Every day | ORAL | 3 refills | Status: DC

## 2020-05-08 NOTE — Telephone Encounter (Signed)
Last apt 04/24/20 Next apt not scheduled. Sent in script

## 2020-06-02 ENCOUNTER — Other Ambulatory Visit: Payer: Self-pay

## 2020-06-03 NOTE — Telephone Encounter (Signed)
[place]vak pt  Last written 04-18-20 #60 Last OV 04-24-20 Next OV 05-01-21 [facility]

## 2020-06-05 MED ORDER — AMPHETAMINE-DEXTROAMPHETAMINE 20 MG PO TABS: 20.0000 mg | ORAL_TABLET | Freq: Every day | ORAL | 0 refills | Status: DC

## 2020-07-17 ENCOUNTER — Other Ambulatory Visit: Payer: Self-pay | Admitting: Internal Medicine

## 2020-07-17 MED ORDER — AMPHETAMINE-DEXTROAMPHETAMINE 20 MG PO TABS: 20.0000 mg | ORAL_TABLET | Freq: Every day | ORAL | 0 refills | Status: DC

## 2020-07-17 NOTE — Telephone Encounter (Signed)
Last office visit 04/24/2020 for CPE.  Last refilled 06/05/2020 for #60 with no refills.  UDS/Contract 02/13/20219.  CPE scheduled for 05/01/2021.

## 2020-09-10 ENCOUNTER — Other Ambulatory Visit: Payer: Self-pay

## 2020-09-11 MED ORDER — AMPHETAMINE-DEXTROAMPHETAMINE 20 MG PO TABS: 20.0000 mg | ORAL_TABLET | Freq: Every day | ORAL | 0 refills | Status: DC

## 2020-09-11 MED ORDER — METHOCARBAMOL 500 MG PO TABS: 500.0000 mg | ORAL_TABLET | Freq: Three times a day (TID) | ORAL | 0 refills | Status: DC | PRN

## 2020-09-11 NOTE — Telephone Encounter (Signed)
Adderall last written 07-17-20 #60 Methocarbamol last written 04-24-20 #60 Last OV 04-24-20 Next OV 05-01-21 [facility]

## 2020-10-20 ENCOUNTER — Other Ambulatory Visit: Payer: Self-pay

## 2020-10-20 MED ORDER — AMPHETAMINE-DEXTROAMPHETAMINE 20 MG PO TABS: 20.0000 mg | ORAL_TABLET | Freq: Every day | ORAL | 0 refills | Status: DC

## 2020-10-20 NOTE — Telephone Encounter (Signed)
Adderall last written 09-11-20 #60 Last OV 04-24-20 Next OV 05-01-21 [facility]

## 2020-12-20 ENCOUNTER — Other Ambulatory Visit: Payer: Self-pay

## 2020-12-21 ENCOUNTER — Other Ambulatory Visit: Payer: Self-pay

## 2020-12-21 MED ORDER — AMPHETAMINE-DEXTROAMPHETAMINE 20 MG PO TABS: 20.0000 mg | ORAL_TABLET | Freq: Every day | ORAL | 0 refills | Status: DC

## 2020-12-21 NOTE — Telephone Encounter (Signed)
Dr. Letvak - please advise on refill. Thank you.  

## 2020-12-22 MED ORDER — AMPHETAMINE-DEXTROAMPHETAMINE 20 MG PO TABS: 20.0000 mg | ORAL_TABLET | Freq: Every day | ORAL | 0 refills | Status: DC

## 2020-12-22 NOTE — Telephone Encounter (Signed)
Dr. Letvak - please advise on refill. Thank you.  

## 2021-03-25 ENCOUNTER — Other Ambulatory Visit: Payer: Self-pay

## 2021-03-26 MED ORDER — AMPHETAMINE-DEXTROAMPHETAMINE 20 MG PO TABS: 20.0000 mg | ORAL_TABLET | Freq: Every day | ORAL | 0 refills | Status: DC

## 2021-03-26 NOTE — Telephone Encounter (Signed)
Adderall last written 12-23-19 #60 Last OV 04-24-20 Next OV 05-01-21 [facility]

## 2021-05-01 ENCOUNTER — Other Ambulatory Visit: Payer: Self-pay | Admitting: Internal Medicine

## 2021-05-01 ENCOUNTER — Encounter: Payer: Managed Care, Other (non HMO) | Admitting: Internal Medicine

## 2021-05-09 ENCOUNTER — Other Ambulatory Visit: Payer: Self-pay

## 2021-05-09 NOTE — Telephone Encounter (Signed)
Last OV - 04/24/2020 Next OV - 05/15/2021 Last Filled - 03/25/2021 [facility] [address]

## 2021-05-10 ENCOUNTER — Other Ambulatory Visit: Payer: Self-pay | Admitting: Internal Medicine

## 2021-05-10 MED ORDER — AMPHETAMINE-DEXTROAMPHETAMINE 20 MG PO TABS: 20.0000 mg | ORAL_TABLET | Freq: Every day | ORAL | 0 refills | Status: DC

## 2021-05-15 ENCOUNTER — Other Ambulatory Visit: Payer: Self-pay

## 2021-05-15 ENCOUNTER — Ambulatory Visit (INDEPENDENT_AMBULATORY_CARE_PROVIDER_SITE_OTHER): Payer: Managed Care, Other (non HMO) | Admitting: Internal Medicine

## 2021-05-15 ENCOUNTER — Encounter: Payer: Self-pay | Admitting: Internal Medicine

## 2021-05-15 VITALS — BP 112/80 | HR 80 | Temp 98.5°F | Ht 70.0 in | Wt 243.0 lb

## 2021-05-15 DIAGNOSIS — Z0000 Encounter for general adult medical examination without abnormal findings: Secondary | ICD-10-CM | POA: Diagnosis not present

## 2021-05-15 DIAGNOSIS — I10 Essential (primary) hypertension: Secondary | ICD-10-CM

## 2021-05-15 DIAGNOSIS — Z1211 Encounter for screening for malignant neoplasm of colon: Secondary | ICD-10-CM

## 2021-05-15 DIAGNOSIS — F900 Attention-deficit hyperactivity disorder, predominantly inattentive type: Secondary | ICD-10-CM

## 2021-05-15 DIAGNOSIS — J301 Allergic rhinitis due to pollen: Secondary | ICD-10-CM

## 2021-05-15 LAB — COMPREHENSIVE METABOLIC PANEL
ALT: 51 U/L (ref 0–53)
AST: 30 U/L (ref 0–37)
Albumin: 4.5 g/dL (ref 3.5–5.2)
Alkaline Phosphatase: 52 U/L (ref 39–117)
BUN: 12 mg/dL (ref 6–23)
CO2: 32 mEq/L (ref 19–32)
Calcium: 9.6 mg/dL (ref 8.4–10.5)
Chloride: 98 mEq/L (ref 96–112)
Creatinine, Ser: 1.19 mg/dL (ref 0.40–1.50)
GFR: 71.68 mL/min (ref >60.00)
Glucose, Bld: 147 mg/dL — ABNORMAL HIGH (ref 70–99)
Potassium: 4.1 mEq/L (ref 3.5–5.1)
Sodium: 137 mEq/L (ref 135–145)
Total Bilirubin: 0.7 mg/dL (ref 0.2–1.2)
Total Protein: 7.3 g/dL (ref 6.0–8.3)

## 2021-05-15 LAB — CBC
HCT: 49.4 % (ref 39.0–52.0)
Hemoglobin: 17.4 g/dL — ABNORMAL HIGH (ref 13.0–17.0)
MCHC: 35.3 g/dL (ref 30.0–36.0)
MCV: 87.9 fl (ref 78.0–100.0)
Platelets: 162 10*3/uL (ref 150.0–400.0)
RBC: 5.62 Mil/uL (ref 4.22–5.81)
RDW: 13.9 % (ref 11.5–15.5)
WBC: 7.1 10*3/uL (ref 4.0–10.5)

## 2021-05-15 MED ORDER — METHOCARBAMOL 500 MG PO TABS: 500.0000 mg | ORAL_TABLET | Freq: Three times a day (TID) | ORAL | 1 refills | Status: DC | PRN

## 2021-05-15 NOTE — Progress Notes (Signed)
Subjective:    Patient ID: John Adkins, male    DOB: September 21, 1971, 50 y.o.   MRN: [id number]  HPI Here for physical This visit occurred during the SARS-CoV-2 public health emergency.  Safety protocols were in place, including screening questions prior to the visit, additional usage of staff PPE, and extensive cleaning of exam room while observing appropriate contact time as indicated for disinfecting solutions.   Still working days--finally getting used to it Having allergy symptoms---some cough (mostly with mask), rhinorrhea  No sore throat or SOB Used to use flonase---benedryl in the past (and zyrtec)  Having some back pain--needs refill on the muscle relaxer Still uses the adderall for work Trazodone for sleep---3-4 days per week  Current Outpatient Medications on File Prior to Visit  Medication Sig Dispense Refill  . amphetamine-dextroamphetamine (ADDERALL) 20 MG tablet Take 1-2 tablets (20-40 mg total) by mouth daily. 60 tablet 0  . losartan-hydrochlorothiazide (HYZAAR) 100-25 MG tablet TAKE 1 TABLET BY MOUTH EVERY DAY 90 tablet 0  . methocarbamol (ROBAXIN) 500 MG tablet Take 1 tablet (500 mg total) by mouth 3 (three) times daily as needed for muscle spasms. 60 tablet 0  . traZODone (DESYREL) 150 MG tablet TAKE 1 TABLET (150 MG TOTAL) BY MOUTH AT BEDTIME. 90 tablet 0   No current facility-administered medications on file prior to visit.    No Known Allergies  Past Medical History:  Diagnosis Date  . Abdominal pain   . ADHD (attention deficit hyperactivity disorder)   . Allergy   . Anxiety   . Diverticulitis   . Hyperlipidemia   . Hypertension   . Wears glasses    wears contacts    Past Surgical History:  Procedure Laterality Date  . ANKLE SURGERY  1993   left ankle   . KNEE SURGERY  1992   right   . SHOULDER SURGERY  10/12   Dr Ave Filter  . Sigmoid colectomy  9/12   Dr Oneida Arenas recurrent diverticulitis  . VASECTOMY      Family History  Problem  Relation Age of Onset  . Diabetes Mother   . Heart disease Mother   . Alzheimer's disease Maternal Grandmother   . Heart disease Maternal Grandfather   . Cancer Father        prostate cancer     Social History   Socioeconomic History  . Marital status: Married    Spouse name: Not on file  . Number of children: 2  . Years of education: Not on file  . Highest education level: Not on file  Occupational History  . Occupation: Chartered certified accountant    Comment:    Tobacco Use  . Smoking status: Never Smoker  . Smokeless tobacco: Current User    Types: Chew  Substance and Sexual Activity  . Alcohol use: Yes    Alcohol/week: 0.0 standard drinks    Comment: occasional  . Drug use: No  . Sexual activity: Not on file  Other Topics Concern  . Not on file  Social History Narrative  . Not on file   Social Determinants of Health   Financial Resource Strain: Not on file  Food Insecurity: Not on file  Transportation Needs: Not on file  Physical Activity: Not on file  Stress: Not on file  Social Connections: Not on file  Intimate Partner Violence: Not on file   Review of Systems  Constitutional:       Weight down slightly Walking now---with [employer] puppy Wears seat belt  HENT: Negative for dental problem, hearing loss and tinnitus.        Keeps up with dentist  Eyes: Negative for visual disturbance.       No diplopia or unilateral vision loss  Respiratory: Negative for cough, chest tightness and shortness of breath.   Cardiovascular: Negative for chest pain, palpitations and leg swelling.  Gastrointestinal: Negative for abdominal pain, blood in stool and constipation.       No heartburn  Endocrine: Negative for polydipsia and polyuria.  Genitourinary: Negative for difficulty urinating and urgency.       No sexual problems  Musculoskeletal: Positive for back pain. Negative for arthralgias and joint swelling.  Skin: Negative for rash.       Vitiligo---no changes   Allergic/Immunologic: Positive for environmental allergies. Negative for immunocompromised state.  Neurological: Negative for dizziness, syncope, light-headedness and headaches.  Hematological: Negative for adenopathy. Does not bruise/bleed easily.  Psychiatric/Behavioral: Negative for dysphoric mood and sleep disturbance. The patient is not nervous/anxious.        Objective:   Physical Exam Constitutional:      Appearance: Normal appearance.  HENT:     Right Ear: Tympanic membrane and ear canal normal.     Left Ear: Tympanic membrane and ear canal normal.     Mouth/Throat:     Pharynx: No oropharyngeal exudate or posterior oropharyngeal erythema.  Eyes:     Conjunctiva/sclera: Conjunctivae normal.     Pupils: Pupils are equal, round, and reactive to light.  Cardiovascular:     Rate and Rhythm: Normal rate and regular rhythm.     Pulses: Normal pulses.     Heart sounds: No murmur heard. No gallop.   Pulmonary:     Effort: Pulmonary effort is normal.     Breath sounds: Normal breath sounds. No wheezing or rales.  Abdominal:     Palpations: Abdomen is soft.     Tenderness: There is no abdominal tenderness.  Musculoskeletal:     Cervical back: Neck supple.     Right lower leg: No edema.     Left lower leg: No edema.  Lymphadenopathy:     Cervical: No cervical adenopathy.  Skin:    Comments: Scattered vitiligo  Neurological:     General: No focal deficit present.     Mental Status: He is alert and oriented to person, place, and time.  Psychiatric:        Mood and Affect: Mood normal.        Behavior: Behavior normal.            Assessment & Plan:

## 2021-05-15 NOTE — Assessment & Plan Note (Signed)
BP Readings from Last 3 Encounters:  05/15/21 112/80  04/24/20 130/84  02/03/19 118/82   Okay on losartan/HCTZ Will check labs

## 2021-05-15 NOTE — Assessment & Plan Note (Signed)
Generally healthy Discussed fitness Still prefers no COVID or flu vaccines Again will give FIT kit

## 2021-05-15 NOTE — Assessment & Plan Note (Signed)
Discussed OTC meds 

## 2021-05-15 NOTE — Assessment & Plan Note (Signed)
Uses the adderall for work Trazodone prn for sleep

## 2021-05-16 ENCOUNTER — Other Ambulatory Visit: Payer: Self-pay | Admitting: Internal Medicine

## 2021-05-16 DIAGNOSIS — R7309 Other abnormal glucose: Secondary | ICD-10-CM

## 2021-05-21 ENCOUNTER — Other Ambulatory Visit: Payer: Self-pay

## 2021-05-21 ENCOUNTER — Other Ambulatory Visit (INDEPENDENT_AMBULATORY_CARE_PROVIDER_SITE_OTHER): Payer: Managed Care, Other (non HMO)

## 2021-05-21 DIAGNOSIS — R7309 Other abnormal glucose: Secondary | ICD-10-CM | POA: Diagnosis not present

## 2021-05-21 LAB — GLUCOSE, RANDOM: Glucose, Bld: 113 mg/dL — ABNORMAL HIGH (ref 70–99)

## 2021-05-21 LAB — HEMOGLOBIN A1C: Hgb A1c MFr Bld: 6.5 % (ref 4.6–6.5)

## 2021-05-28 ENCOUNTER — Other Ambulatory Visit: Payer: Self-pay

## 2021-05-28 MED ORDER — METFORMIN HCL ER 500 MG PO TB24: 500.0000 mg | ORAL_TABLET | Freq: Every day | ORAL | 3 refills | Status: DC

## 2021-07-03 ENCOUNTER — Other Ambulatory Visit: Payer: Self-pay

## 2021-07-03 MED ORDER — AMPHETAMINE-DEXTROAMPHETAMINE 20 MG PO TABS: 20.0000 mg | ORAL_TABLET | Freq: Every day | ORAL | 0 refills | Status: DC

## 2021-07-03 NOTE — Telephone Encounter (Signed)
Last filled 05-10-21 #60 Last OV 05-09-21 Next OV 05-15-22 [facility]

## 2021-08-04 ENCOUNTER — Other Ambulatory Visit: Payer: Self-pay | Admitting: Internal Medicine

## 2021-08-06 ENCOUNTER — Other Ambulatory Visit: Payer: Self-pay | Admitting: Internal Medicine

## 2021-08-20 ENCOUNTER — Other Ambulatory Visit: Payer: Self-pay

## 2021-08-21 MED ORDER — AMPHETAMINE-DEXTROAMPHETAMINE 20 MG PO TABS: 20.0000 mg | ORAL_TABLET | Freq: Every day | ORAL | 0 refills | Status: DC

## 2021-08-21 NOTE — Telephone Encounter (Signed)
Last filled 07-03-21 #60 Last OV 05-09-21 Next OV 05-15-22 [facility]

## 2021-10-07 ENCOUNTER — Other Ambulatory Visit: Payer: Self-pay

## 2021-10-08 MED ORDER — AMPHETAMINE-DEXTROAMPHETAMINE 20 MG PO TABS: 20.0000 mg | ORAL_TABLET | Freq: Every day | ORAL | 0 refills | Status: DC

## 2021-10-08 NOTE — Telephone Encounter (Signed)
Last Filled 08-21-21 #60 Last OV 05-15-21 Next OV 05-15-22 [facility]

## 2021-11-18 ENCOUNTER — Other Ambulatory Visit: Payer: Self-pay | Admitting: Internal Medicine

## 2021-11-19 MED ORDER — AMPHETAMINE-DEXTROAMPHETAMINE 20 MG PO TABS: 20.0000 mg | ORAL_TABLET | Freq: Every day | ORAL | 0 refills | Status: DC

## 2021-11-19 NOTE — Telephone Encounter (Signed)
Last Filled 910-24-22 #60 Last OV 05-15-21 Next OV 05-15-22 [facility]

## 2022-01-12 ENCOUNTER — Other Ambulatory Visit: Payer: Self-pay | Admitting: Internal Medicine

## 2022-01-14 MED ORDER — AMPHETAMINE-DEXTROAMPHETAMINE 20 MG PO TABS: 20.0000 mg | ORAL_TABLET | Freq: Every day | ORAL | 0 refills | Status: DC

## 2022-01-14 NOTE — Telephone Encounter (Signed)
Last filled on 11/18/21 # 60 with 0 refill  Last OV 05-15-21-CPE Next OV 05-15-22- CPE [facility]  Last UDS and Contract on 01/28/2018

## 2022-03-20 ENCOUNTER — Other Ambulatory Visit: Payer: Self-pay | Admitting: Internal Medicine

## 2022-03-20 MED ORDER — AMPHETAMINE-DEXTROAMPHETAMINE 20 MG PO TABS: 20.0000 mg | ORAL_TABLET | Freq: Every day | ORAL | 0 refills | Status: DC

## 2022-03-20 NOTE — Telephone Encounter (Signed)
Last filled on 01-14-22 # 76  ?Last OV 05-15-21-CPE ?Next OV 05-15-22- CPE ?[facility] ?

## 2022-05-07 ENCOUNTER — Other Ambulatory Visit: Payer: Self-pay | Admitting: Internal Medicine

## 2022-05-08 ENCOUNTER — Other Ambulatory Visit: Payer: Self-pay | Admitting: Internal Medicine

## 2022-05-08 MED ORDER — AMPHETAMINE-DEXTROAMPHETAMINE 20 MG PO TABS: 20.0000 mg | ORAL_TABLET | Freq: Every day | ORAL | 0 refills | Status: DC

## 2022-05-08 NOTE — Telephone Encounter (Signed)
Name of Medication: Adderall 20 mg Name of Pharmacy: [facility] Last [place] or Written Date and Quantity: #60 on 03/20/2022 Last Office Visit and Type: 05/15/2021 annual Next Office Visit and Type: 05/15/2022 CPX Last Controlled Substance Agreement Date: 01/28/2018 Last UDS:01/28/2018

## 2022-05-15 ENCOUNTER — Encounter: Payer: Self-pay | Admitting: Internal Medicine

## 2022-05-15 ENCOUNTER — Ambulatory Visit (INDEPENDENT_AMBULATORY_CARE_PROVIDER_SITE_OTHER): Payer: Managed Care, Other (non HMO) | Admitting: Internal Medicine

## 2022-05-15 VITALS — BP 112/76 | HR 84 | Temp 97.4°F | Ht 70.25 in | Wt 232.0 lb

## 2022-05-15 DIAGNOSIS — I10 Essential (primary) hypertension: Secondary | ICD-10-CM | POA: Diagnosis not present

## 2022-05-15 DIAGNOSIS — R7303 Prediabetes: Secondary | ICD-10-CM | POA: Insufficient documentation

## 2022-05-15 DIAGNOSIS — Z1211 Encounter for screening for malignant neoplasm of colon: Secondary | ICD-10-CM

## 2022-05-15 DIAGNOSIS — Z0000 Encounter for general adult medical examination without abnormal findings: Secondary | ICD-10-CM

## 2022-05-15 LAB — COMPREHENSIVE METABOLIC PANEL
ALT: 53 U/L (ref 0–53)
AST: 27 U/L (ref 0–37)
Albumin: 4.9 g/dL (ref 3.5–5.2)
Alkaline Phosphatase: 43 U/L (ref 39–117)
BUN: 25 mg/dL — ABNORMAL HIGH (ref 6–23)
CO2: 31 mEq/L (ref 19–32)
Calcium: 10 mg/dL (ref 8.4–10.5)
Chloride: 98 mEq/L (ref 96–112)
Creatinine, Ser: 1.23 mg/dL (ref 0.40–1.50)
GFR: 68.41 mL/min (ref >60.00)
Glucose, Bld: 82 mg/dL (ref 70–99)
Potassium: 4.3 mEq/L (ref 3.5–5.1)
Sodium: 136 mEq/L (ref 135–145)
Total Bilirubin: 0.5 mg/dL (ref 0.2–1.2)
Total Protein: 7.6 g/dL (ref 6.0–8.3)

## 2022-05-15 LAB — CBC
HCT: 50.3 % (ref 39.0–52.0)
Hemoglobin: 17.4 g/dL — ABNORMAL HIGH (ref 13.0–17.0)
MCHC: 34.5 g/dL (ref 30.0–36.0)
MCV: 88.6 fl (ref 78.0–100.0)
Platelets: 177 10*3/uL (ref 150.0–400.0)
RBC: 5.68 Mil/uL (ref 4.22–5.81)
RDW: 13.2 % (ref 11.5–15.5)
WBC: 6 10*3/uL (ref 4.0–10.5)

## 2022-05-15 LAB — LIPID PANEL
Cholesterol: 168 mg/dL (ref 0–200)
HDL: 32.3 mg/dL — ABNORMAL LOW (ref >39.00)
LDL Cholesterol: 114 mg/dL — ABNORMAL HIGH (ref 0–99)
NonHDL: 135.24
Total CHOL/HDL Ratio: 5
Triglycerides: 106 mg/dL (ref 0.0–149.0)
VLDL: 21.2 mg/dL (ref 0.0–40.0)

## 2022-05-15 LAB — HEMOGLOBIN A1C: Hgb A1c MFr Bld: 5.6 % (ref 4.6–6.5)

## 2022-05-15 MED ORDER — TRIAMCINOLONE ACETONIDE 0.1 % EX CREA: 1.0000 "application " | TOPICAL_CREAM | Freq: Two times a day (BID) | CUTANEOUS | 1 refills | Status: AC | PRN

## 2022-05-15 NOTE — Assessment & Plan Note (Signed)
Doing well with metformin 500 daily and carb restriction Has lost some weight

## 2022-05-15 NOTE — Progress Notes (Signed)
Subjective:    Patient ID: John Adkins, male    DOB: 06/18/1971, 51 y.o.   MRN: [id number]  HPI Here for physical  Feels pretty good Working more---overtime. Very physical work Not enough time for exercise Does walk his dog though  Still uses the adderall for work  Visual merchandiser the metformin Has cut out carbs Weight down 10# from last year  Current Outpatient Medications on File Prior to Visit  Medication Sig Dispense Refill   amphetamine-dextroamphetamine (ADDERALL) 20 MG tablet Take 1-2 tablets (20-40 mg total) by mouth daily. 60 tablet 0   losartan-hydrochlorothiazide (HYZAAR) 100-25 MG tablet TAKE 1 TABLET BY MOUTH EVERY DAY 90 tablet 3   metFORMIN (GLUCOPHAGE-XR) 500 MG 24 hr tablet TAKE 1 TABLET BY MOUTH EVERY DAY WITH BREAKFAST 90 tablet 3   methocarbamol (ROBAXIN) 500 MG tablet Take 1 tablet (500 mg total) by mouth 3 (three) times daily as needed for muscle spasms. 60 tablet 1   traZODone (DESYREL) 150 MG tablet TAKE 1 TABLET BY MOUTH AT BEDTIME. 90 tablet 3   No current facility-administered medications on file prior to visit.    No Known Allergies  Past Medical History:  Diagnosis Date   Abdominal pain    ADHD (attention deficit hyperactivity disorder)    Allergy    Anxiety    Diverticulitis    Hyperlipidemia    Hypertension    Wears glasses    wears contacts    Past Surgical History:  Procedure Laterality Date   ANKLE SURGERY  1993   left ankle    KNEE SURGERY  1992   right    SHOULDER SURGERY  10/12   Dr Ave Filter   Sigmoid colectomy  9/12   Dr Oneida Arenas recurrent diverticulitis   VASECTOMY      Family History  Problem Relation Age of Onset   Diabetes Mother    Heart disease Mother    Cancer Father        prostate cancer    Melanoma Father    Alzheimer's disease Maternal Grandmother    Heart disease Maternal Grandfather     Social History   Socioeconomic History   Marital status: Married    Spouse name: Not on file   Number of  children: 2   Years of education: Not on file   Highest education level: Not on file  Occupational History   Occupation: Chartered certified accountant    Comment:    Tobacco Use   Smoking status: Never   Smokeless tobacco: Current    Types: Chew  Substance and Sexual Activity   Alcohol use: Yes    Alcohol/week: 0.0 standard drinks    Comment: occasional   Drug use: No   Sexual activity: Not on file  Other Topics Concern   Not on file  Social History Narrative   Not on file   Social Determinants of Health   Financial Resource Strain: Not on file  Food Insecurity: Not on file  Transportation Needs: Not on file  Physical Activity: Not on file  Stress: Not on file  Social Connections: Not on file  Intimate Partner Violence: Not on file   Review of Systems  Constitutional:  Negative for fatigue.       Does feel his age some--but okay energy Wears seat belt  HENT:  Negative for dental problem, hearing loss and tinnitus.   Eyes:  Negative for visual disturbance.       No diplopia or unilateral vision loss  Respiratory:  Negative for cough, chest tightness and shortness of breath.   Cardiovascular:  Negative for chest pain, palpitations and leg swelling.  Gastrointestinal:  Negative for blood in stool and constipation.       No heartburn  Endocrine: Negative for polydipsia and polyuria.  Genitourinary:  Negative for difficulty urinating and urgency.       No sexual problems  Musculoskeletal:  Positive for arthralgias and back pain. Negative for joint swelling.       Aleve helps prn  Skin:  Negative for rash.       Gets itching at times--might be related to coolant at work  Allergic/Immunologic: Positive for environmental allergies. Negative for immunocompromised state.       Mild symptoms--rare cetiriine  Neurological:  Negative for dizziness, syncope, light-headedness and headaches.  Hematological:  Negative for adenopathy. Does not bruise/bleed easily.  Psychiatric/Behavioral:  Negative  for dysphoric mood and sleep disturbance. The patient is not nervous/anxious.       Objective:   Physical Exam Constitutional:      Appearance: Normal appearance.  HENT:     Mouth/Throat:     Pharynx: No oropharyngeal exudate or posterior oropharyngeal erythema.  Eyes:     Conjunctiva/sclera: Conjunctivae normal.     Pupils: Pupils are equal, round, and reactive to light.  Cardiovascular:     Rate and Rhythm: Normal rate and regular rhythm.     Pulses: Normal pulses.     Heart sounds: No murmur heard.   No gallop.  Pulmonary:     Effort: Pulmonary effort is normal.     Breath sounds: Normal breath sounds. No wheezing or rales.  Abdominal:     Palpations: Abdomen is soft.     Tenderness: There is no abdominal tenderness.  Musculoskeletal:     Cervical back: Neck supple.     Right lower leg: No edema.     Left lower leg: No edema.  Lymphadenopathy:     Cervical: No cervical adenopathy.  Skin:    Findings: No lesion or rash.  Neurological:     General: No focal deficit present.     Mental Status: He is alert and oriented to person, place, and time.  Psychiatric:        Mood and Affect: Mood normal.        Behavior: Behavior normal.           Assessment & Plan:

## 2022-05-15 NOTE — Assessment & Plan Note (Addendum)
Healthy Discussed fitness Discussed dip--he is not willing to give it up Will set up colonoscopy Consider PSA at 55 Prefers no flu or COVID vaccines He will consider shingrix

## 2022-05-15 NOTE — Assessment & Plan Note (Signed)
BP Readings from Last 3 Encounters:  05/15/22 112/76  05/15/21 112/80  04/24/20 130/84   Good control on losartan/HCTZ 100/25 Will check labs

## 2022-05-30 ENCOUNTER — Encounter: Payer: Self-pay | Admitting: Gastroenterology

## 2022-06-04 ENCOUNTER — Ambulatory Visit (AMBULATORY_SURGERY_CENTER): Payer: Self-pay | Admitting: *Deleted

## 2022-06-04 VITALS — Ht 70.25 in | Wt 229.0 lb

## 2022-06-04 DIAGNOSIS — Z1211 Encounter for screening for malignant neoplasm of colon: Secondary | ICD-10-CM

## 2022-06-04 MED ORDER — NA SULFATE-K SULFATE-MG SULF 17.5-3.13-1.6 GM/177ML PO SOLN: 1.0000 | ORAL | 0 refills | Status: DC

## 2022-06-04 NOTE — Progress Notes (Signed)
Patient is here in-person for PV. Patient denies any allergies to eggs or soy. Patient denies any problems with anesthesia/sedation. Patient is not on any oxygen at home. Patient is not taking any diet/weight loss medications or blood thinners. Patient is aware of our care-partner policy. Patient notified to use Singlecard card given to pt for prescription.   EMMI education assigned to the patient for the procedure, sent to MyChart.

## 2022-06-20 ENCOUNTER — Encounter: Payer: Self-pay | Admitting: Gastroenterology

## 2022-06-26 ENCOUNTER — Ambulatory Visit (AMBULATORY_SURGERY_CENTER): Payer: Managed Care, Other (non HMO) | Admitting: Gastroenterology

## 2022-06-26 ENCOUNTER — Encounter: Payer: Self-pay | Admitting: Gastroenterology

## 2022-06-26 VITALS — BP 118/91 | HR 72 | Temp 98.6°F | Resp 12 | Ht 70.25 in | Wt 229.0 lb

## 2022-06-26 DIAGNOSIS — Z1211 Encounter for screening for malignant neoplasm of colon: Secondary | ICD-10-CM

## 2022-06-26 MED ORDER — SODIUM CHLORIDE 0.9 % IV SOLN: 500.0000 mL | Freq: Once | INTRAVENOUS | Status: DC

## 2022-06-26 NOTE — Progress Notes (Unsigned)
VS completed by DT.  Pt's states no medical or surgical changes since previsit or office visit.  

## 2022-06-26 NOTE — Patient Instructions (Signed)
Handouts on hemorrhoids and diverticulosis.  No repeat colonoscopy for surveillance for 10 years! Resume previous diet and continue present medications.   YOU HAD AN ENDOSCOPIC PROCEDURE TODAY AT THE [facility]:   Refer to the procedure report that was given to you for any specific questions about what was found during the examination.  If the procedure report does not answer your questions, please call your gastroenterologist to clarify.  If you requested that your care partner not be given the details of your procedure findings, then the procedure report has been included in a sealed envelope for you to review at your convenience later.  YOU SHOULD EXPECT: Some feelings of bloating in the abdomen. Passage of more gas than usual.  Walking can help get rid of the air that was put into your GI tract during the procedure and reduce the bloating. If you had a lower endoscopy (such as a colonoscopy or flexible sigmoidoscopy) you may notice spotting of blood in your stool or on the toilet paper. If you underwent a bowel prep for your procedure, you may not have a normal bowel movement for a few days.  Please Note:  You might notice some irritation and congestion in your nose or some drainage.  This is from the oxygen used during your procedure.  There is no need for concern and it should clear up in a day or so.  SYMPTOMS TO REPORT IMMEDIATELY:  Following lower endoscopy (colonoscopy or flexible sigmoidoscopy):  Excessive amounts of blood in the stool  Significant tenderness or worsening of abdominal pains  Swelling of the abdomen that is new, acute  Fever of 100F or higher  For urgent or emergent issues, a gastroenterologist can be reached at any hour by calling [phone]. Do not use MyChart messaging for urgent concerns.    DIET:  We do recommend a small meal at first, but then you may proceed to your regular diet.  Drink plenty of fluids but you should avoid alcoholic  beverages for 24 hours.  ACTIVITY:  You should plan to take it easy for the rest of today and you should NOT DRIVE or use heavy machinery until tomorrow (because of the sedation medicines used during the test).    FOLLOW UP: Our staff will call the number listed on your records the next business day following your procedure.  We will call around 7:15- 8:00 am to check on you and address any questions or concerns that you may have regarding the information given to you following your procedure. If we do not reach you, we will leave a message.  If you develop any symptoms (ie: fever, flu-like symptoms, shortness of breath, cough etc.) before then, please call [phone].  If you test positive for Covid 19 in the 2 weeks post procedure, please call and report this information to [place].    If any biopsies were taken you will be contacted by phone or by letter within the next 1-3 weeks.  Please call us at [phone] if you have not heard about the biopsies in 3 weeks.    SIGNATURES/CONFIDENTIALITY: You and/or your care partner have signed paperwork which will be entered into your electronic medical record.  These signatures attest to the fact that that the information above on your After Visit Summary has been reviewed and is understood.  Full responsibility of the confidentiality of this discharge information lies with you and/or your care-partner.

## 2022-06-26 NOTE — Progress Notes (Unsigned)
History and Physical:  This patient presents for endoscopic testing for: Encounter Diagnosis  Name Primary?   Special screening for malignant neoplasms, colon Yes    Average risk for CRC - first screening exam. Patient denies chronic abdominal pain, rectal bleeding, constipation or diarrhea.   Patient is otherwise without complaints or active issues today.   Past Medical History: Past Medical History:  Diagnosis Date   Abdominal pain    ADHD (attention deficit hyperactivity disorder)    Allergy    Anxiety    Diverticulitis 2012   Hyperlipidemia    Hypertension    Prediabetes    Wears glasses    wears contacts     Past Surgical History: Past Surgical History:  Procedure Laterality Date   ANKLE SURGERY  1993   left ankle    KNEE SURGERY  1992   right    SHOULDER SURGERY  10/12   Dr Ave Filter   Sigmoid colectomy  9/12   Dr Oneida Arenas recurrent diverticulitis   VASECTOMY      Allergies: No Known Allergies  Outpatient Meds: Current Outpatient Medications  Medication Sig Dispense Refill   amphetamine-dextroamphetamine (ADDERALL) 20 MG tablet Take 1-2 tablets (20-40 mg total) by mouth daily. 60 tablet 0   losartan-hydrochlorothiazide (HYZAAR) 100-25 MG tablet TAKE 1 TABLET BY MOUTH EVERY DAY 90 tablet 3   metFORMIN (GLUCOPHAGE-XR) 500 MG 24 hr tablet TAKE 1 TABLET BY MOUTH EVERY DAY WITH BREAKFAST 90 tablet 3   traZODone (DESYREL) 150 MG tablet TAKE 1 TABLET BY MOUTH AT BEDTIME. 90 tablet 3   methocarbamol (ROBAXIN) 500 MG tablet Take 1 tablet (500 mg total) by mouth 3 (three) times daily as needed for muscle spasms. 60 tablet 1   triamcinolone cream (KENALOG) 0.1 % Apply 1 application. topically 2 (two) times daily as needed. 45 g 1   Current Facility-Administered Medications  Medication Dose Route Frequency Provider Last Rate Last Admin   0.9 %  sodium chloride infusion  500 mL Intravenous Once Charlie Pitter III, MD           ___________________________________________________________________ Objective   Exam:  BP 111/70   Pulse 77   Temp 98.6 F (37 C) (Temporal)   Ht 5' 10.25" (1.784 m)   Wt 229 lb (103.9 kg)   SpO2 96%   BMI 32.62 kg/m   CV: RRR without murmur, S1/S2 Resp: clear to auscultation bilaterally, normal RR and effort noted GI: soft, no tenderness, with active bowel sounds.   Assessment: Encounter Diagnosis  Name Primary?   Special screening for malignant neoplasms, colon Yes     Plan: Colonoscopy  The benefits and risks of the planned procedure were described in detail with the patient or (when appropriate) their health care proxy.  Risks were outlined as including, but not limited to, bleeding, infection, perforation, adverse medication reaction leading to cardiac or pulmonary decompensation, pancreatitis (if ERCP).  The limitation of incomplete mucosal visualization was also discussed.  No guarantees or warranties were given.    The patient is appropriate for an endoscopic procedure in the ambulatory setting.   - John Jupiter, MD

## 2022-06-26 NOTE — Op Note (Signed)
[facility] Patient Name: John Adkins Procedure Date: 06/26/2022 11:40 AM MRN: [id number] Endoscopist: Sherilyn Cooter L. Myrtie Neither , MD Age: 51 Referring MD:  Date of Birth: [birth year] Gender: Male Account #: [id number] Procedure:                Colonoscopy Indications:              Screening for colorectal malignant neoplasm, This                            is the patient's first colonoscopy Medicines:                Monitored Anesthesia Care Procedure:                Pre-Anesthesia Assessment:                           - Prior to the procedure, a History and Physical                            was performed, and patient medications and                            allergies were reviewed. The patient's tolerance of                            previous anesthesia was also reviewed. The risks                            and benefits of the procedure and the sedation                            options and risks were discussed with the patient.                            All questions were answered, and informed consent                            was obtained. Prior Anticoagulants: The patient has                            taken no previous anticoagulant or antiplatelet                            agents. ASA Grade Assessment: II - A patient with                            mild systemic disease. After reviewing the risks                            and benefits, the patient was deemed in                            satisfactory condition to undergo the procedure.  After obtaining informed consent, the colonoscope                            was passed under direct vision. Throughout the                            procedure, the patient's blood pressure, pulse, and                            oxygen saturations were monitored continuously. The                            Colonoscope was introduced through the anus and                            advanced to the the cecum,  identified by                            appendiceal orifice and ileocecal valve. The                            colonoscopy was performed without difficulty. The                            patient tolerated the procedure well. The quality                            of the bowel preparation was good. The ileocecal                            valve, appendiceal orifice, and rectum were                            photographed. Scope In: 11:48:59 AM Scope Out: 12:00:21 PM Scope Withdrawal Time: 0 hours 10 minutes 4 seconds  Total Procedure Duration: 0 hours 11 minutes 22 seconds  Findings:                 The perianal and digital rectal examinations were                            normal.                           Repeat examination of right colon under NBI                            performed.                           A few small-mouthed diverticula were found in the                            left colon.  Internal hemorrhoids were found. The hemorrhoids                            were small.                           The exam was otherwise without abnormality on                            direct and retroflexion views. Complications:            No immediate complications. Estimated Blood Loss:     Estimated blood loss: none. Impression:               - Diverticulosis in the left colon.                           - Internal hemorrhoids.                           - The examination was otherwise normal on direct                            and retroflexion views.                           - No specimens collected. Recommendation:           - Patient has a contact number available for                            emergencies. The signs and symptoms of potential                            delayed complications were discussed with the                            patient. Return to normal activities tomorrow.                            Written discharge instructions were  provided to the                            patient.                           - Resume previous diet.                           - Continue present medications.                           - Repeat colonoscopy in 10 years for screening                            purposes. Dason Mosley L. Myrtie Neither, MD 06/26/2022 12:04:09 PM This report has been signed electronically.

## 2022-06-26 NOTE — Progress Notes (Unsigned)
Pt non-responsive, VVS, Report to RN  °

## 2022-06-27 ENCOUNTER — Telehealth: Payer: Self-pay

## 2022-06-27 NOTE — Telephone Encounter (Signed)
  Follow up Call-     06/26/2022   10:58 AM  Call back number  Post procedure Call Back phone  # [phone]  Permission to leave phone message Yes     Patient questions:  Do you have a fever, pain , or abdominal swelling? No. Pain Score  0 *  Have you tolerated food without any problems? Yes.    Have you been able to return to your normal activities? Yes.    Do you have any questions about your discharge instructions: Diet   No. Medications  No. Follow up visit  No.  Do you have questions or concerns about your Care? No.  Actions: * If pain score is 4 or above: No action needed, pain <4.

## 2022-07-02 ENCOUNTER — Other Ambulatory Visit: Payer: Self-pay | Admitting: Internal Medicine

## 2022-07-02 MED ORDER — AMPHETAMINE-DEXTROAMPHETAMINE 20 MG PO TABS: 20.0000 mg | ORAL_TABLET | Freq: Every day | ORAL | 0 refills | Status: DC

## 2022-07-02 NOTE — Telephone Encounter (Signed)
Last filled on 05-08-22 # 60  Last OV 05-15-22 CPE Next OV 05-20-23 CPE [facility]

## 2022-08-25 ENCOUNTER — Other Ambulatory Visit: Payer: Self-pay | Admitting: Internal Medicine

## 2022-08-28 ENCOUNTER — Other Ambulatory Visit: Payer: Self-pay | Admitting: Internal Medicine

## 2022-08-28 MED ORDER — AMPHETAMINE-DEXTROAMPHETAMINE 20 MG PO TABS: 20.0000 mg | ORAL_TABLET | Freq: Every day | ORAL | 0 refills | Status: DC

## 2022-08-28 NOTE — Telephone Encounter (Signed)
Last filled on 07-02-22 # 60  Last OV 05-15-22 CPE Next OV 05-20-23 CPE [facility]

## 2022-10-12 ENCOUNTER — Other Ambulatory Visit: Payer: Self-pay | Admitting: Internal Medicine

## 2022-10-14 MED ORDER — AMPHETAMINE-DEXTROAMPHETAMINE 20 MG PO TABS: 20.0000 mg | ORAL_TABLET | Freq: Every day | ORAL | 0 refills | Status: DC

## 2022-10-14 NOTE — Telephone Encounter (Signed)
Last filled on 08-28-22 # 60  Last OV 05-15-22 CPE Next OV 05-20-23 CPE [facility]

## 2022-12-02 ENCOUNTER — Other Ambulatory Visit: Payer: Self-pay | Admitting: Internal Medicine

## 2022-12-03 MED ORDER — AMPHETAMINE-DEXTROAMPHETAMINE 20 MG PO TABS: 20.0000 mg | ORAL_TABLET | Freq: Every day | ORAL | 0 refills | Status: DC

## 2022-12-03 NOTE — Telephone Encounter (Signed)
Last filled on 10-14-22 # 60  Last OV 05-15-22 CPE Next OV 05-20-23 CPE [facility]

## 2023-01-27 ENCOUNTER — Other Ambulatory Visit: Payer: Self-pay | Admitting: Internal Medicine

## 2023-01-27 MED ORDER — AMPHETAMINE-DEXTROAMPHETAMINE 20 MG PO TABS: 20.0000 mg | ORAL_TABLET | Freq: Every day | ORAL | 0 refills | Status: DC

## 2023-01-27 NOTE — Telephone Encounter (Signed)
Prescription Request  01/27/2023  Is this a "Controlled Substance" medicine? No  LOV: 05/15/2022  What is the name of the medication or equipment? amphetamine-dextroamphetamine (ADDERALL) 20 MG tablet   Have you contacted your pharmacy to request a refill? No   Which pharmacy would you like this sent to?  [id number] [address]Phone: [phone]: [phone]   Patient notified that their request is being sent to the clinical staff for review and that they should receive a response within 2 business days.   Please advise at [place]

## 2023-01-28 NOTE — Telephone Encounter (Signed)
Duplicate request

## 2023-02-22 ENCOUNTER — Other Ambulatory Visit: Payer: Self-pay | Admitting: Internal Medicine

## 2023-04-01 ENCOUNTER — Other Ambulatory Visit: Payer: Self-pay | Admitting: Internal Medicine

## 2023-04-01 MED ORDER — AMPHETAMINE-DEXTROAMPHETAMINE 20 MG PO TABS: 20.0000 mg | ORAL_TABLET | Freq: Every day | ORAL | 0 refills | Status: DC

## 2023-04-01 NOTE — Telephone Encounter (Signed)
Last filled on 01-27-23 # 60  Last OV 05-15-22 CPE Next OV 05-26-23 CPE [facility]

## 2023-05-20 ENCOUNTER — Encounter: Payer: Managed Care, Other (non HMO) | Admitting: Internal Medicine

## 2023-05-24 ENCOUNTER — Other Ambulatory Visit: Payer: Self-pay | Admitting: Internal Medicine

## 2023-05-26 ENCOUNTER — Encounter: Payer: Managed Care, Other (non HMO) | Admitting: Internal Medicine

## 2023-06-01 ENCOUNTER — Other Ambulatory Visit: Payer: Self-pay | Admitting: Internal Medicine

## 2023-06-02 MED ORDER — AMPHETAMINE-DEXTROAMPHETAMINE 20 MG PO TABS: 20.0000 mg | ORAL_TABLET | Freq: Every day | ORAL | 0 refills | Status: DC

## 2023-06-02 NOTE — Telephone Encounter (Signed)
LVM for patient to c/b and schedule.  

## 2023-06-02 NOTE — Telephone Encounter (Signed)
Last filled on 04-01-23 # 60  Last OV 05-15-22 CPE No Future OV canceled CPE 05-30-23 [facility]

## 2023-06-23 DIAGNOSIS — M25561 Pain in right knee: Secondary | ICD-10-CM | POA: Diagnosis not present

## 2023-07-02 DIAGNOSIS — M25561 Pain in right knee: Secondary | ICD-10-CM | POA: Diagnosis not present

## 2023-07-17 ENCOUNTER — Ambulatory Visit (INDEPENDENT_AMBULATORY_CARE_PROVIDER_SITE_OTHER): Payer: BC Managed Care – PPO | Admitting: Internal Medicine

## 2023-07-17 ENCOUNTER — Encounter: Payer: Self-pay | Admitting: Internal Medicine

## 2023-07-17 VITALS — BP 110/70 | HR 77 | Temp 97.7°F | Ht 70.25 in | Wt 225.0 lb

## 2023-07-17 DIAGNOSIS — F900 Attention-deficit hyperactivity disorder, predominantly inattentive type: Secondary | ICD-10-CM

## 2023-07-17 DIAGNOSIS — Z0000 Encounter for general adult medical examination without abnormal findings: Secondary | ICD-10-CM

## 2023-07-17 DIAGNOSIS — I10 Essential (primary) hypertension: Secondary | ICD-10-CM | POA: Diagnosis not present

## 2023-07-17 DIAGNOSIS — R7303 Prediabetes: Secondary | ICD-10-CM | POA: Diagnosis not present

## 2023-07-17 DIAGNOSIS — Z125 Encounter for screening for malignant neoplasm of prostate: Secondary | ICD-10-CM

## 2023-07-17 LAB — CBC
HCT: 50.8 % (ref 39.0–52.0)
Hemoglobin: 17.5 g/dL — ABNORMAL HIGH (ref 13.0–17.0)
MCHC: 34.5 g/dL (ref 30.0–36.0)
MCV: 87.7 fl (ref 78.0–100.0)
Platelets: 188 10*3/uL (ref 150.0–400.0)
RBC: 5.8 Mil/uL (ref 4.22–5.81)
RDW: 13.2 % (ref 11.5–15.5)
WBC: 6.5 10*3/uL (ref 4.0–10.5)

## 2023-07-17 LAB — PSA: PSA: 1.02 ng/mL (ref 0.10–4.00)

## 2023-07-17 LAB — COMPREHENSIVE METABOLIC PANEL
ALT: 51 U/L (ref 0–53)
AST: 26 U/L (ref 0–37)
Albumin: 4.9 g/dL (ref 3.5–5.2)
Alkaline Phosphatase: 50 U/L (ref 39–117)
BUN: 25 mg/dL — ABNORMAL HIGH (ref 6–23)
CO2: 32 mEq/L (ref 19–32)
Calcium: 10.3 mg/dL (ref 8.4–10.5)
Chloride: 96 mEq/L (ref 96–112)
Creatinine, Ser: 1.2 mg/dL (ref 0.40–1.50)
GFR: 69.89 mL/min (ref >60.00)
Glucose, Bld: 90 mg/dL (ref 70–99)
Potassium: 4 mEq/L (ref 3.5–5.1)
Sodium: 137 mEq/L (ref 135–145)
Total Bilirubin: 0.8 mg/dL (ref 0.2–1.2)
Total Protein: 7.3 g/dL (ref 6.0–8.3)

## 2023-07-17 LAB — LIPID PANEL
Cholesterol: 246 mg/dL — ABNORMAL HIGH (ref 0–200)
HDL: 33.3 mg/dL — ABNORMAL LOW (ref >39.00)
LDL Cholesterol: 176 mg/dL — ABNORMAL HIGH (ref 0–99)
NonHDL: 212.25
Total CHOL/HDL Ratio: 7
Triglycerides: 180 mg/dL — ABNORMAL HIGH (ref 0.0–149.0)
VLDL: 36 mg/dL (ref 0.0–40.0)

## 2023-07-17 LAB — HEMOGLOBIN A1C: Hgb A1c MFr Bld: 5.8 % (ref 4.6–6.5)

## 2023-07-17 NOTE — Progress Notes (Signed)
Subjective:    Patient ID: John Adkins, male    DOB: December 05, 1971, 52 y.o.   MRN: [id number]  HPI Here for physical  Has a "bum" right knee Had MRI and is waiting to see what is planned Hard to walk or exercise  Now working on a different machine---still a machinist  Still limiting carbs--most of the time Has lost a few more pounds  Current Outpatient Medications on File Prior to Visit  Medication Sig Dispense Refill   amphetamine-dextroamphetamine (ADDERALL) 20 MG tablet Take 1-2 tablets (20-40 mg total) by mouth daily. 60 tablet 0   losartan-hydrochlorothiazide (HYZAAR) 100-25 MG tablet TAKE 1 TABLET BY MOUTH EVERY DAY 90 tablet 0   metFORMIN (GLUCOPHAGE-XR) 500 MG 24 hr tablet TAKE 1 TABLET BY MOUTH EVERY DAY WITH BREAKFAST 90 tablet 0   methocarbamol (ROBAXIN) 500 MG tablet Take 1 tablet (500 mg total) by mouth 3 (three) times daily as needed for muscle spasms. 60 tablet 1   traZODone (DESYREL) 150 MG tablet TAKE 1 TABLET BY MOUTH EVERYDAY AT BEDTIME 90 tablet 0   triamcinolone cream (KENALOG) 0.1 % Apply 1 application. topically 2 (two) times daily as needed. 45 g 1   No current facility-administered medications on file prior to visit.    No Active Allergies  Past Medical History:  Diagnosis Date   Abdominal pain    ADHD (attention deficit hyperactivity disorder)    Allergy    Anxiety    Diverticulitis 2012   Hyperlipidemia    Hypertension    Prediabetes    Wears glasses    wears contacts    Past Surgical History:  Procedure Laterality Date   ANKLE SURGERY  1993   left ankle    KNEE SURGERY  1992   right    SHOULDER SURGERY  10/12   Dr Ave Filter   Sigmoid colectomy  9/12   Dr Oneida Arenas recurrent diverticulitis   VASECTOMY      Family History  Problem Relation Age of Onset   Colon polyps Mother    Diabetes Mother    Heart disease Mother    Cancer Father        prostate cancer    Melanoma Father    Alzheimer's disease Maternal Grandmother     Heart disease Maternal Grandfather    Colon cancer Neg Hx    Esophageal cancer Neg Hx    Stomach cancer Neg Hx     Social History   Socioeconomic History   Marital status: Married    Spouse name: Not on file   Number of children: 2   Years of education: Not on file   Highest education level: Not on file  Occupational History   Occupation: Chartered certified accountant    Comment:    Tobacco Use   Smoking status: Never   Smokeless tobacco: Current    Types: Chew  Vaping Use   Vaping status: Never Used  Substance and Sexual Activity   Alcohol use: Yes    Comment: occasional   Drug use: No   Sexual activity: Not on file  Other Topics Concern   Not on file  Social History Narrative   Not on file   Social Determinants of Health   Financial Resource Strain: Not on file  Food Insecurity: Not on file  Transportation Needs: Not on file  Physical Activity: Not on file  Stress: Not on file  Social Connections: Unknown (04/30/2022)   Received from [facility]   Social Network  Social Network: Not on file  Intimate Partner Violence: Unknown (03/22/2022)   Received from [facility]   HITS    Physically Hurt: Not on file    Insult or Talk Down To: Not on file    Threaten Physical Harm: Not on file    Scream or Curse: Not on file   Review of Systems  Constitutional:  Negative for fatigue.       Wears seat betl  HENT:  Negative for dental problem, hearing loss and tinnitus.        Keeps up with dentist  Eyes:  Negative for visual disturbance.       No diplopia or unilateral vision loss  Respiratory:  Negative for cough, chest tightness and shortness of breath.   Cardiovascular:  Negative for chest pain, palpitations and leg swelling.  Gastrointestinal:  Negative for blood in stool and constipation.       No heartburn  Endocrine: Negative for polydipsia and polyuria.  Genitourinary:  Negative for difficulty urinating and urgency.       No sexual problems  Musculoskeletal:  Negative for  back pain.       Only problem is right knee  Skin:  Negative for rash.       No suspicious skin lesions  Allergic/Immunologic: Negative for environmental allergies and immunocompromised state.  Neurological:  Negative for dizziness, syncope, light-headedness and headaches.  Hematological:  Negative for adenopathy. Does not bruise/bleed easily.  Psychiatric/Behavioral:  Negative for dysphoric mood and sleep disturbance. The patient is not nervous/anxious.        Objective:   Physical Exam Constitutional:      Appearance: Normal appearance.  HENT:     Mouth/Throat:     Pharynx: No oropharyngeal exudate or posterior oropharyngeal erythema.  Eyes:     Conjunctiva/sclera: Conjunctivae normal.     Pupils: Pupils are equal, round, and reactive to light.  Cardiovascular:     Rate and Rhythm: Normal rate and regular rhythm.     Pulses: Normal pulses.     Heart sounds: No murmur heard.    No gallop.  Pulmonary:     Effort: Pulmonary effort is normal.     Breath sounds: Normal breath sounds. No wheezing or rales.  Abdominal:     Palpations: Abdomen is soft.     Tenderness: There is no abdominal tenderness.  Musculoskeletal:     Cervical back: Neck supple.     Right lower leg: No edema.     Left lower leg: No edema.  Lymphadenopathy:     Cervical: No cervical adenopathy.  Skin:    Findings: No lesion or rash.  Neurological:     General: No focal deficit present.     Mental Status: He is alert and oriented to person, place, and time.  Psychiatric:        Mood and Affect: Mood normal.        Behavior: Behavior normal.            Assessment & Plan:

## 2023-07-17 NOTE — Assessment & Plan Note (Signed)
BP Readings from Last 3 Encounters:  07/17/23 110/70  06/26/22 (!) 118/91  05/15/22 112/76   Good control on the losartan/hydrochlorothiazide 100/25

## 2023-07-17 NOTE — Assessment & Plan Note (Signed)
Healthy Just had colon--due again 10 years Discussed PSA---we will check (Dad had it) Still prefers no flu, COVID, shingrix vaccines Will exercise more when knee is better

## 2023-07-17 NOTE — Assessment & Plan Note (Signed)
Just for work

## 2023-07-17 NOTE — Assessment & Plan Note (Signed)
Sugar had normalized with weight loss and metformin

## 2023-07-28 ENCOUNTER — Other Ambulatory Visit: Payer: Self-pay | Admitting: Internal Medicine

## 2023-07-28 DIAGNOSIS — S83206A Unspecified tear of unspecified meniscus, current injury, right knee, initial encounter: Secondary | ICD-10-CM | POA: Diagnosis not present

## 2023-07-28 MED ORDER — AMPHETAMINE-DEXTROAMPHETAMINE 20 MG PO TABS: 20.0000 mg | ORAL_TABLET | Freq: Every day | ORAL | 0 refills | Status: DC

## 2023-07-28 NOTE — Telephone Encounter (Signed)
Last filled on 06-02-23 # 60  Last OV/CPE 07/17/23 Next OV 07/19/24 [facility]

## 2023-08-06 ENCOUNTER — Telehealth: Payer: Self-pay | Admitting: Internal Medicine

## 2023-08-06 NOTE — Telephone Encounter (Signed)
Most recent labs routed to them.

## 2023-08-06 NOTE — Telephone Encounter (Signed)
Melissa from [facility] called stated she needs copy of pt last blood work . Is needed for pt surgery  # [phone]   Fax #[phone]

## 2023-08-13 DIAGNOSIS — M65861 Other synovitis and tenosynovitis, right lower leg: Secondary | ICD-10-CM | POA: Diagnosis not present

## 2023-08-13 DIAGNOSIS — S83281A Other tear of lateral meniscus, current injury, right knee, initial encounter: Secondary | ICD-10-CM | POA: Diagnosis not present

## 2023-08-13 DIAGNOSIS — M659 Synovitis and tenosynovitis, unspecified: Secondary | ICD-10-CM | POA: Diagnosis not present

## 2023-08-13 DIAGNOSIS — S83271A Complex tear of lateral meniscus, current injury, right knee, initial encounter: Secondary | ICD-10-CM | POA: Diagnosis not present

## 2023-08-13 DIAGNOSIS — M2241 Chondromalacia patellae, right knee: Secondary | ICD-10-CM | POA: Diagnosis not present

## 2023-08-20 DIAGNOSIS — M25561 Pain in right knee: Secondary | ICD-10-CM | POA: Diagnosis not present

## 2023-08-20 DIAGNOSIS — M25661 Stiffness of right knee, not elsewhere classified: Secondary | ICD-10-CM | POA: Diagnosis not present

## 2023-08-22 ENCOUNTER — Other Ambulatory Visit: Payer: Self-pay | Admitting: Internal Medicine

## 2023-08-27 DIAGNOSIS — M25561 Pain in right knee: Secondary | ICD-10-CM | POA: Diagnosis not present

## 2023-08-27 DIAGNOSIS — M25661 Stiffness of right knee, not elsewhere classified: Secondary | ICD-10-CM | POA: Diagnosis not present

## 2023-08-29 DIAGNOSIS — M25561 Pain in right knee: Secondary | ICD-10-CM | POA: Diagnosis not present

## 2023-08-29 DIAGNOSIS — M25661 Stiffness of right knee, not elsewhere classified: Secondary | ICD-10-CM | POA: Diagnosis not present

## 2023-09-03 DIAGNOSIS — M25561 Pain in right knee: Secondary | ICD-10-CM | POA: Diagnosis not present

## 2023-09-03 DIAGNOSIS — M25661 Stiffness of right knee, not elsewhere classified: Secondary | ICD-10-CM | POA: Diagnosis not present

## 2023-10-01 ENCOUNTER — Other Ambulatory Visit: Payer: Self-pay | Admitting: Internal Medicine

## 2023-10-02 MED ORDER — AMPHETAMINE-DEXTROAMPHETAMINE 20 MG PO TABS: 20.0000 mg | ORAL_TABLET | Freq: Every day | ORAL | 0 refills | Status: DC

## 2023-10-02 NOTE — Telephone Encounter (Signed)
Last filled 07-28-23 #60 Last OV 07-17-23 Next OV 07-19-24 [facility]

## 2023-11-10 DIAGNOSIS — M25561 Pain in right knee: Secondary | ICD-10-CM | POA: Diagnosis not present

## 2023-11-18 ENCOUNTER — Other Ambulatory Visit: Payer: Self-pay | Admitting: Internal Medicine

## 2023-11-18 MED ORDER — AMPHETAMINE-DEXTROAMPHETAMINE 20 MG PO TABS: 20.0000 mg | ORAL_TABLET | Freq: Every day | ORAL | 0 refills | Status: DC

## 2023-12-31 ENCOUNTER — Other Ambulatory Visit: Payer: Self-pay | Admitting: Orthopedic Surgery

## 2023-12-31 DIAGNOSIS — M25561 Pain in right knee: Secondary | ICD-10-CM

## 2024-01-11 ENCOUNTER — Other Ambulatory Visit: Payer: Self-pay | Admitting: Internal Medicine

## 2024-01-11 ENCOUNTER — Ambulatory Visit
Admission: RE | Admit: 2024-01-11 | Discharge: 2024-01-11 | Disposition: A | Discharge status: Home / Self Care | Payer: Worker's Compensation | Source: Ambulatory Visit | Attending: Orthopedic Surgery | Admitting: Orthopedic Surgery

## 2024-01-11 DIAGNOSIS — M25561 Pain in right knee: Secondary | ICD-10-CM

## 2024-01-12 MED ORDER — AMPHETAMINE-DEXTROAMPHETAMINE 20 MG PO TABS: 20.0000 mg | ORAL_TABLET | Freq: Every day | ORAL | 0 refills | Status: DC

## 2024-01-12 NOTE — Telephone Encounter (Signed)
Last filled 11-18-23 #60 Last OV 07-17-23 Next OV 07-19-24 [facility]

## 2024-03-20 ENCOUNTER — Other Ambulatory Visit: Payer: Self-pay | Admitting: Internal Medicine

## 2024-03-22 MED ORDER — AMPHETAMINE-DEXTROAMPHETAMINE 20 MG PO TABS: 20.0000 mg | ORAL_TABLET | Freq: Every day | ORAL | 0 refills | Status: DC

## 2024-03-22 NOTE — Telephone Encounter (Signed)
 Last filled 01-12-24 #60 Last OV 07-17-23 Next OV 07-19-24 [facility]

## 2024-05-19 ENCOUNTER — Other Ambulatory Visit: Payer: Self-pay | Admitting: Internal Medicine

## 2024-05-19 MED ORDER — AMPHETAMINE-DEXTROAMPHETAMINE 20 MG PO TABS: 20.0000 mg | ORAL_TABLET | Freq: Every day | ORAL | 0 refills | Status: DC

## 2024-05-19 NOTE — Telephone Encounter (Signed)
 Copied from CRM [id number]. Topic: Clinical - Medication Refill >> May 19, 2024 10:12 AM Deaijah H wrote: Medication: amphetamine -dextroamphetamine  (ADDERALL) 20 MG tablet  Has the patient contacted their pharmacy? Yes (Agent: If no, request that the patient contact the pharmacy for the refill. If patient does not wish to contact the pharmacy document the reason why and proceed with request.) (Agent: If yes, when and what did the pharmacy advise?) couldn't get ahold of pharmacy  This is the patient's preferred pharmacy:  [id number] [place] [facility] - [address] [place] [address] Phone: [phone] Fax: [phone]  Is this the correct pharmacy for this prescription? Yes If no, delete pharmacy and type the correct one.   Has the prescription been filled recently? Yes  Is the patient out of the medication? Yes  Has the patient been seen for an appointment in the last year OR does the patient have an upcoming appointment? Yes  Can we respond through MyChart? Yes  Agent: Please be advised that Rx refills may take up to 3 business days. We ask that you follow-up with your pharmacy.

## 2024-05-19 NOTE — Telephone Encounter (Signed)
 Last filled 03-22-24 #60 Last OV 07-17-23 Next OV 07-19-24 [facility]

## 2024-07-08 ENCOUNTER — Other Ambulatory Visit: Payer: Self-pay | Admitting: Internal Medicine

## 2024-07-08 MED ORDER — AMPHETAMINE-DEXTROAMPHETAMINE 20 MG PO TABS: 20.0000 mg | ORAL_TABLET | Freq: Every day | ORAL | 0 refills | Status: DC

## 2024-07-08 NOTE — Telephone Encounter (Signed)
 Last filled 05-19-24 #60 Last OV 07-17-23 Next OV 07-19-24 [facility]

## 2024-07-08 NOTE — Telephone Encounter (Signed)
 Copied from CRM [id number]. Topic: Clinical - Medication Refill >> Jul 08, 2024  3:40 PM Deaijah H wrote: Medication: amphetamine -dextroamphetamine  (ADDERALL) 20 MG tablet  Has the patient contacted their pharmacy? Yes (Agent: If no, request that the patient contact the pharmacy for the refill. If patient does not wish to contact the pharmacy document the reason why and proceed with request.) (Agent: If yes, when and what did the pharmacy advise?) 0 refills   This is the patient's preferred pharmacy:  [id number] - [address] [place] [address] Phone: [phone] Fax: [phone]   Is this the correct pharmacy for this prescription? Yes If no, delete pharmacy and type the correct one.   Has the prescription been filled recently? Yes  Is the patient out of the medication? Yes  Has the patient been seen for an appointment in the last year OR does the patient have an upcoming appointment? Yes  Can we respond through MyChart? Yes  Agent: Please be advised that Rx refills may take up to 3 business days. We ask that you follow-up with your pharmacy.

## 2024-07-19 ENCOUNTER — Encounter: Payer: Self-pay | Admitting: Internal Medicine

## 2024-07-19 ENCOUNTER — Ambulatory Visit (INDEPENDENT_AMBULATORY_CARE_PROVIDER_SITE_OTHER): Payer: BC Managed Care – PPO | Admitting: Internal Medicine

## 2024-07-19 ENCOUNTER — Ambulatory Visit: Payer: Self-pay | Admitting: Internal Medicine

## 2024-07-19 VITALS — BP 116/74 | HR 69 | Temp 98.4°F | Ht 70.5 in | Wt 215.0 lb

## 2024-07-19 DIAGNOSIS — Z0000 Encounter for general adult medical examination without abnormal findings: Secondary | ICD-10-CM

## 2024-07-19 DIAGNOSIS — Z125 Encounter for screening for malignant neoplasm of prostate: Secondary | ICD-10-CM

## 2024-07-19 DIAGNOSIS — I10 Essential (primary) hypertension: Secondary | ICD-10-CM | POA: Diagnosis not present

## 2024-07-19 DIAGNOSIS — F900 Attention-deficit hyperactivity disorder, predominantly inattentive type: Secondary | ICD-10-CM | POA: Diagnosis not present

## 2024-07-19 LAB — LIPID PANEL
Cholesterol: 209 mg/dL — ABNORMAL HIGH (ref 0–200)
HDL: 36 mg/dL — ABNORMAL LOW (ref >39.00)
LDL Cholesterol: 153 mg/dL — ABNORMAL HIGH (ref 0–99)
NonHDL: 173.26
Total CHOL/HDL Ratio: 6
Triglycerides: 100 mg/dL (ref 0.0–149.0)
VLDL: 20 mg/dL (ref 0.0–40.0)

## 2024-07-19 LAB — CBC
HCT: 48.9 % (ref 39.0–52.0)
Hemoglobin: 17 g/dL (ref 13.0–17.0)
MCHC: 34.8 g/dL (ref 30.0–36.0)
MCV: 86.1 fl (ref 78.0–100.0)
Platelets: 184 K/uL (ref 150.0–400.0)
RBC: 5.68 Mil/uL (ref 4.22–5.81)
RDW: 13.2 % (ref 11.5–15.5)
WBC: 5.6 K/uL (ref 4.0–10.5)

## 2024-07-19 LAB — COMPREHENSIVE METABOLIC PANEL WITH GFR
ALT: 30 U/L (ref 0–53)
AST: 20 U/L (ref 0–37)
Albumin: 5 g/dL (ref 3.5–5.2)
Alkaline Phosphatase: 46 U/L (ref 39–117)
BUN: 13 mg/dL (ref 6–23)
CO2: 33 meq/L — ABNORMAL HIGH (ref 19–32)
Calcium: 10.5 mg/dL (ref 8.4–10.5)
Chloride: 99 meq/L (ref 96–112)
Creatinine, Ser: 1.14 mg/dL (ref 0.40–1.50)
GFR: 73.8 mL/min (ref >60.00)
Glucose, Bld: 96 mg/dL (ref 70–99)
Potassium: 4.6 meq/L (ref 3.5–5.1)
Sodium: 138 meq/L (ref 135–145)
Total Bilirubin: 0.9 mg/dL (ref 0.2–1.2)
Total Protein: 7.1 g/dL (ref 6.0–8.3)

## 2024-07-19 LAB — PSA: PSA: 1.21 ng/mL (ref 0.10–4.00)

## 2024-07-19 NOTE — Assessment & Plan Note (Signed)
 Does well with adderall 20 for work

## 2024-07-19 NOTE — Progress Notes (Addendum)
 Subjective:    Patient ID: John Adkins, male    DOB: [birth year], 53 y.o.   MRN: [id number]  HPI Here for physical  Still with some right knee issues Did have arthroscopy---did well for a while till he had a bad step at work 6 weeks off then--with PT Due for gel injection On celebrex  Has lost 10# over the past year No longer checks sugars  Feels he gets tired easily Wonders about testosterone Does work out No sig ED now--does okay without meds  Still uses the adderall for work  Current Outpatient Medications on File Prior to Visit  Medication Sig Dispense Refill   amphetamine -dextroamphetamine  (ADDERALL) 20 MG tablet Take 1-2 tablets (20-40 mg total) by mouth daily. 60 tablet 0   losartan -hydrochlorothiazide  (HYZAAR) 100-25 MG tablet TAKE 1 TABLET BY MOUTH EVERY DAY 90 tablet 3   traZODone  (DESYREL ) 150 MG tablet TAKE 1 TABLET BY MOUTH EVERYDAY AT BEDTIME 90 tablet 3   triamcinolone  cream (KENALOG ) 0.1 % Apply 1 application. topically 2 (two) times daily as needed. 45 g 1   No current facility-administered medications on file prior to visit.    No Active Allergies  Past Medical History:  Diagnosis Date   Abdominal pain    ADHD (attention deficit hyperactivity disorder)    Allergy    Anxiety    Diverticulitis 2012   Hyperlipidemia    Hypertension    Prediabetes    Wears glasses    wears contacts    Past Surgical History:  Procedure Laterality Date   ANKLE SURGERY  1993   left ankle    KNEE SURGERY  1992   right    SHOULDER SURGERY  10/12   Dr Dozier   Sigmoid colectomy  9/12   Dr Salvatore recurrent diverticulitis   VASECTOMY      Family History  Problem Relation Age of Onset   Colon polyps Mother    Diabetes Mother    Heart disease Mother    Cancer Father        prostate cancer    Melanoma Father    Alzheimer's disease Maternal Grandmother    Heart disease Maternal Grandfather    Colon cancer Neg Hx    Esophageal cancer Neg Hx     Stomach cancer Neg Hx     Social History   Socioeconomic History   Marital status: Married    Spouse name: Not on file   Number of children: 2   Years of education: Not on file   Highest education level: Not on file  Occupational History   Occupation: Chartered certified accountant    Comment:    Tobacco Use   Smoking status: Never   Smokeless tobacco: Current    Types: Chew  Vaping Use   Vaping status: Never Used  Substance and Sexual Activity   Alcohol use: Yes    Comment: occasional   Drug use: No   Sexual activity: Not on file  Other Topics Concern   Not on file  Social History Narrative   Not on file   Social Drivers of Health   Financial Resource Strain: Not on file  Food Insecurity: Not on file  Transportation Needs: Not on file  Physical Activity: Not on file  Stress: Not on file  Social Connections: Unknown (04/30/2022)   Received from [facility]   Social Network    Social Network: Not on file  Intimate Partner Violence: Unknown (03/22/2022)   [facility]  HITS    Physically Hurt: Not on file    Insult or Talk Down To: Not on file    Threaten Physical Harm: Not on file    Scream or Curse: Not on file   Review of Systems  Constitutional:        Some changes in energy levels Wears seat belt  HENT:  Negative for dental problem, hearing loss and tinnitus.        Keeps up with dentist  Eyes:  Negative for visual disturbance.       No diplopia or unilateral vision loss  Respiratory:  Negative for cough, chest tightness and shortness of breath.   Cardiovascular:  Negative for chest pain, palpitations and leg swelling.  Gastrointestinal:  Negative for blood in stool and constipation.       No heartburn  Endocrine: Negative for polydipsia and polyuria.  Genitourinary:  Positive for frequency. Negative for difficulty urinating and urgency.  Musculoskeletal:  Negative for back pain and joint swelling.       Right knee is only problem Back better with weight  loss  Skin:  Negative for rash.  Allergic/Immunologic: Negative for environmental allergies and immunocompromised state.  Neurological:  Negative for dizziness, syncope, light-headedness and headaches.  Hematological:  Negative for adenopathy. Does not bruise/bleed easily.  Psychiatric/Behavioral:  Negative for dysphoric mood and sleep disturbance. The patient is not nervous/anxious.        Objective:   Physical Exam Constitutional:      Appearance: Normal appearance.  HENT:     Mouth/Throat:     Pharynx: No oropharyngeal exudate or posterior oropharyngeal erythema.  Eyes:     Conjunctiva/sclera: Conjunctivae normal.     Pupils: Pupils are equal, round, and reactive to light.  Cardiovascular:     Rate and Rhythm: Normal rate and regular rhythm.     Pulses: Normal pulses.     Heart sounds: No murmur heard.    No gallop.  Pulmonary:     Effort: Pulmonary effort is normal.     Breath sounds: Normal breath sounds. No wheezing or rales.  Abdominal:     Palpations: Abdomen is soft.     Tenderness: There is no abdominal tenderness.  Musculoskeletal:     Cervical back: Neck supple.     Right lower leg: No edema.     Left lower leg: No edema.  Lymphadenopathy:     Cervical: No cervical adenopathy.  Skin:    Findings: No lesion.     Comments: Scattered areas of vitiligo  Neurological:     General: No focal deficit present.     Mental Status: He is alert and oriented to person, place, and time.  Psychiatric:        Mood and Affect: Mood normal.        Behavior: Behavior normal.            Assessment & Plan:

## 2024-07-19 NOTE — Assessment & Plan Note (Signed)
 BP Readings from Last 3 Encounters:  07/19/24 116/74  07/17/23 110/70  06/26/22 (!) 118/91   Controlled on losartan /hydrochlorothiazide  100/25

## 2024-07-19 NOTE — Assessment & Plan Note (Signed)
 Healthy Discussed fitness Colon due 2033 Check PSA--with +FH Still prefers no immunizations

## 2024-08-31 ENCOUNTER — Telehealth: Payer: Self-pay | Admitting: Internal Medicine

## 2024-08-31 ENCOUNTER — Other Ambulatory Visit: Payer: Self-pay | Admitting: Family

## 2024-08-31 MED ORDER — AMPHETAMINE-DEXTROAMPHETAMINE 20 MG PO TABS: 20.0000 mg | ORAL_TABLET | Freq: Every day | ORAL | 0 refills | Status: DC

## 2024-08-31 NOTE — Telephone Encounter (Unsigned)
 Copied from CRM [id number]. Topic: Clinical - Medication Refill >> Aug 31, 2024  2:34 PM Harlene ORN wrote: Medication: amphetamine -dextroamphetamine  (ADDERALL) 20 MG tablet  Has the patient contacted their pharmacy? No (Agent: If no, request that the patient contact the pharmacy for the refill. If patient does not wish to contact the pharmacy document the reason why and proceed with request.) (Agent: If yes, when and what did the pharmacy advise?)  This is the patient's preferred pharmacy:  [id number] [address] [facility] - [address] [place] [address] Phone: [phone] Fax: [phone]   Is this the correct pharmacy for this prescription? Yes If no, delete pharmacy and type the correct one.   Has the prescription been filled recently? No  Is the patient out of the medication? Yes  Has the patient been seen for an appointment in the last year OR does the patient have an upcoming appointment? Yes  Can we respond through MyChart? Yes  Agent: Please be advised that Rx refills may take up to 3 business days. We ask that you follow-up with your pharmacy.

## 2024-09-09 ENCOUNTER — Encounter

## 2024-09-27 ENCOUNTER — Other Ambulatory Visit: Payer: Self-pay

## 2024-09-27 MED ORDER — LOSARTAN POTASSIUM-HCTZ 100-25 MG PO TABS: 1.0000 | ORAL_TABLET | Freq: Every day | ORAL | 0 refills | Status: AC

## 2024-09-27 NOTE — Telephone Encounter (Signed)
 Rx sent electronically.

## 2024-10-26 ENCOUNTER — Other Ambulatory Visit: Payer: Self-pay

## 2024-10-26 MED ORDER — TRAZODONE HCL 150 MG PO TABS: ORAL_TABLET | ORAL | 0 refills | Status: DC

## 2024-10-27 ENCOUNTER — Other Ambulatory Visit: Payer: Self-pay

## 2024-10-27 NOTE — Telephone Encounter (Unsigned)
 Copied from CRM [id number]. Topic: Clinical - Medication Refill >> Oct 27, 2024  2:37 PM Brittany M wrote: Medication: amphetamine -dextroamphetamine  (ADDERALL) 20 MG tablet  Has the patient contacted their pharmacy? Yes (Agent: If no, request that the patient contact the pharmacy for the refill. If patient does not wish to contact the pharmacy document the reason why and proceed with request.) (Agent: If yes, when and what did the pharmacy advise?)  This is the patient's preferred pharmacy:  [id number] [address] [facility] - [address] [place] [address] Phone: [phone] Fax: [phone]   Is this the correct pharmacy for this prescription? Yes If no, delete pharmacy and type the correct one.   Has the prescription been filled recently? Yes  Is the patient out of the medication? Yes  Has the patient been seen for an appointment in the last year OR does the patient have an upcoming appointment? Yes  Can we respond through MyChart? Yes  Agent: Please be advised that Rx refills may take up to 3 business days. We ask that you follow-up with your pharmacy.

## 2024-11-01 MED ORDER — AMPHETAMINE-DEXTROAMPHETAMINE 20 MG PO TABS: 20.0000 mg | ORAL_TABLET | Freq: Every day | ORAL | 0 refills | Status: DC

## 2024-11-02 ENCOUNTER — Encounter

## 2024-11-23 ENCOUNTER — Other Ambulatory Visit: Payer: Self-pay | Admitting: Family

## 2024-12-22 ENCOUNTER — Other Ambulatory Visit: Payer: Self-pay

## 2024-12-22 NOTE — Telephone Encounter (Signed)
 Copied from CRM #[id number]. Topic: Clinical - Medication Refill >> Dec 22, 2024 11:54 AM Wess RAMAN wrote: Medication: amphetamine -dextroamphetamine  (ADDERALL) 20 MG tablet   Has the patient contacted their pharmacy? No (Agent: If no, request that the patient contact the pharmacy for the refill. If patient does not wish to contact the pharmacy document the reason why and proceed with request.) (Agent: If yes, when and what did the pharmacy advise?)  This is the patient's preferred pharmacy:  [id number] [address] [facility] - [address] [place] [address] Phone: [phone] Fax: [phone]  Is this the correct pharmacy for this prescription? Yes If no, delete pharmacy and type the correct one.   Has the prescription been filled recently? Yes  Is the patient out of the medication? Yes  Has the patient been seen for an appointment in the last year OR does the patient have an upcoming appointment? Yes  Can we respond through MyChart? Yes  Agent: Please be advised that Rx refills may take up to 3 business days. We ask that you follow-up with your pharmacy.

## 2024-12-23 MED ORDER — AMPHETAMINE-DEXTROAMPHETAMINE 20 MG PO TABS: 20.0000 mg | ORAL_TABLET | Freq: Every day | ORAL | 0 refills | Status: DC

## 2025-01-03 ENCOUNTER — Ambulatory Visit (INDEPENDENT_AMBULATORY_CARE_PROVIDER_SITE_OTHER)

## 2025-01-03 VITALS — BP 120/88 | HR 86 | Temp 97.7°F | Ht 70.5 in | Wt 241.0 lb

## 2025-01-03 DIAGNOSIS — Z77018 Contact with and (suspected) exposure to other hazardous metals: Secondary | ICD-10-CM | POA: Insufficient documentation

## 2025-01-03 DIAGNOSIS — E7849 Other hyperlipidemia: Secondary | ICD-10-CM | POA: Diagnosis not present

## 2025-01-03 DIAGNOSIS — F900 Attention-deficit hyperactivity disorder, predominantly inattentive type: Secondary | ICD-10-CM | POA: Diagnosis not present

## 2025-01-03 DIAGNOSIS — I10 Essential (primary) hypertension: Secondary | ICD-10-CM | POA: Diagnosis not present

## 2025-01-03 DIAGNOSIS — R7303 Prediabetes: Secondary | ICD-10-CM | POA: Diagnosis not present

## 2025-01-03 DIAGNOSIS — M25561 Pain in right knee: Secondary | ICD-10-CM | POA: Insufficient documentation

## 2025-01-03 DIAGNOSIS — R058 Other specified cough: Secondary | ICD-10-CM

## 2025-01-03 LAB — HEMOGLOBIN A1C: Hgb A1c MFr Bld: 6.8 % — ABNORMAL HIGH (ref 4.6–6.5)

## 2025-01-03 MED ORDER — AMPHETAMINE-DEXTROAMPHETAMINE 20 MG PO TABS: 40.0000 mg | ORAL_TABLET | Freq: Every day | ORAL | Status: AC

## 2025-01-03 NOTE — Patient Instructions (Signed)
 Thank you for visiting [facility] today! Here's what we talked about: - Call pulmonary office if you do not hear from them in 2 weeks

## 2025-01-03 NOTE — Progress Notes (Signed)
 "  Subjective:   This visit was conducted in person. The patient gave informed consent to the use of Abridge AI technology to record the contents of the encounter as documented below.   Patient ID: MALEAK BRAZZEL, male    DOB: 1971/10/30, 54 y.o.   MRN: [id number]   Discussed the use of AI scribe software for clinical note transcription with the patient, who gave verbal consent to proceed.  History of Present Illness RUDIE SERMONS is a 54 year old male who presents with white patches on his gums and lips.  He has white patches on his gums and lips, which led to a biopsy. The biopsy involved cutting through the patches rather than scraping them off. He has not expressed any specific concerns related to this issue during the visit.  He has a history of hypertension, managed with Hyzaar. He notes that weight fluctuations affect his blood pressure control, stating 'when I do low carb, I can almost quit taking the thing.'  He has ADHD, primarily characterized by inattention, and takes Adderall mostly on weekends to focus at work, with a dosage of two tablets a day. He does not require a refill at this time.  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 He mentions that he does better on third shift and describes himself as a 'night owl.'  He has a history of prediabetes, with a previous A1c of 6.5% three years ago, and reports that it was steady a year ago. He acknowledges dietary challenges, especially during holidays, and mentions 'I've been eating good, been eating good, you said? Oh, no.'  He has a history of high cholesterol, with a recent risk calculation of 7%. He currently does not exercise due to knee issues.  He has a history of right knee problems, including a partial meniscectomy in 2024 and a hyperextension injury at work three months post-surgery. He reports improvement and is able to perform rehab  exercises at home.  He quit using chewing tobacco six weeks ago after 40 years, now using nicotine pouches to manage oral fixation. He has no history of smoking.  He reports occasional alcohol consumption and lives with his wife, who works as a teacher, early years/pre. He has two adult sons living independently. No shortness of breath or significant cough, though notes occasional nasal congestion and rare cough related to work environment.      Review of Systems  All other systems reviewed and are negative.       Allergies[1]  Medications Ordered Prior to Encounter[2]  BP 120/88 (BP Location: Left Arm, Patient Position: Sitting, Cuff Size: Large)   Pulse 86   Temp 97.7 F (36.5 C) (Oral)   Ht 5' 10.5 (1.791 m)   Wt 241 lb (109.3 kg)   SpO2 94%   BMI 34.09 kg/m   Objective:      Physical Exam VITALS: BP- 120/88 GENERAL: Alert, cooperative, well developed, no acute distress. HEAD: Normocephalic atraumatic. EYES: Extraocular movements intact BL, pupils round, equal and reactive to light BL, conjunctivae normal BL. EXTREMITIES: No cyanosis or edema. NEUROLOGICAL: Oriented to person, place and time, no gait abnormalities, moves all extremities without gross motor or sensory deficit.        Assessment & Plan:   Assessment & Plan ADHD, predominantly inattentive type Managed with Adderall, primarily on weekends for focus at work. Controlled substance use agreement and annual urine test required.  Symptoms well-controlled, no changes - Updated controlled substance use agreement. - Obtained urine sample for annual controlled substance monitoring. 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  Ordering PFTs to rule out restrictive lung disease.  - Ordered lung function tests to assess for respiratory  toxicity. - Instructed to call pulmonary office if no contact in two weeks regarding lung function test.   Prediabetes Previous A1c of 6.5% three years ago, ever since, A1c has been in prediabetic range, most recently 5.8. Recent dietary changes with reduced portion sizes and less fast food intake. - Ordered repeat A1c test to assess current status. - Encouraged sustainable dietary changes, including reducing portion sizes and increasing vegetable intake.  Hyperlipidemia Recent lipid panel shows ASCVD risk of 7%, no statin indicated at this time. Encouraged lifestyle modifications to improve cholesterol levels. - Encouraged dietary changes to include lean meats and increased fruits and vegetables. - Encouraged regular exercise to improve HDL cholesterol levels.  Hypertension Well-controlled with Hyzaar. Blood pressure today is 120/88 mmHg.  No changes to regimen - Continue Hyzaar 1 tablet daily for blood pressure management. - Encouraged weight management to aid in blood pressure control.    Return in about 6 months (around 07/19/2025) for CPE.   Whitlee Sluder K Kymani Shimabukuro, MD  01/03/25     Contains text generated by Abridge.       [1] No Active Allergies [2]  Current Outpatient Medications on File Prior to Visit  Medication Sig Dispense Refill   losartan -hydrochlorothiazide  (HYZAAR) 100-25 MG tablet Take 1 tablet by mouth daily. 90 tablet 0   traZODone  (DESYREL ) 150 MG tablet TAKE 1 TABLET BY MOUTH EVERYDAY AT BEDTIME 90 tablet 3   triamcinolone  cream (KENALOG ) 0.1 % Apply 1 application. topically 2 (two) times daily as needed. 45 g 1   No current facility-administered medications on file prior to visit.   "

## 2025-01-05 LAB — DRUG MONITORING, PANEL 8 WITH CONFIRMATION, URINE
6 Acetylmorphine: NEGATIVE ng/mL
Alcohol Metabolites: NEGATIVE ng/mL
Amphetamine: 2312 ng/mL — ABNORMAL HIGH
Amphetamines: POSITIVE ng/mL — AB
Benzodiazepines: NEGATIVE ng/mL
Buprenorphine, Urine: NEGATIVE ng/mL
Cocaine Metabolite: NEGATIVE ng/mL
Creatinine: 202.9 mg/dL
MDMA: NEGATIVE ng/mL
Marijuana Metabolite: NEGATIVE ng/mL
Methamphetamine: NEGATIVE ng/mL
Opiates: NEGATIVE ng/mL
Oxidant: NEGATIVE ug/mL
Oxycodone: NEGATIVE ng/mL
pH: 6.4 (ref 4.5–9.0)

## 2025-01-05 LAB — DM TEMPLATE

## 2025-01-09 ENCOUNTER — Ambulatory Visit: Payer: Self-pay

## 2025-01-09 DIAGNOSIS — E119 Type 2 diabetes mellitus without complications: Secondary | ICD-10-CM | POA: Insufficient documentation

## 2025-01-24 ENCOUNTER — Ambulatory Visit

## 2025-07-20 ENCOUNTER — Encounter
